# Patient Record
Sex: Male | Born: 1955 | Race: White | Hispanic: No | Smoking: Current every day smoker
Health system: Southern US, Community
[De-identification: ages and names within clinical notes are randomized; demographics above are authoritative.]

## PROBLEM LIST (undated history)

## (undated) DIAGNOSIS — T8859XA Other complications of anesthesia, initial encounter: Secondary | ICD-10-CM

## (undated) DIAGNOSIS — Z8619 Personal history of other infectious and parasitic diseases: Secondary | ICD-10-CM

## (undated) DIAGNOSIS — I10 Essential (primary) hypertension: Secondary | ICD-10-CM

## (undated) DIAGNOSIS — E785 Hyperlipidemia, unspecified: Secondary | ICD-10-CM

## (undated) DIAGNOSIS — G4700 Insomnia, unspecified: Secondary | ICD-10-CM

## (undated) DIAGNOSIS — K279 Peptic ulcer, site unspecified, unspecified as acute or chronic, without hemorrhage or perforation: Secondary | ICD-10-CM

## (undated) DIAGNOSIS — J45909 Unspecified asthma, uncomplicated: Secondary | ICD-10-CM

## (undated) HISTORY — DX: Insomnia, unspecified: G47.00

## (undated) HISTORY — DX: Hyperlipidemia, unspecified: E78.5

## (undated) HISTORY — DX: Essential (primary) hypertension: I10

## (undated) HISTORY — DX: Personal history of other infectious and parasitic diseases: Z86.19

## (undated) HISTORY — PX: BACK SURGERY: SHX140

## (undated) HISTORY — DX: Peptic ulcer, site unspecified, unspecified as acute or chronic, without hemorrhage or perforation: K27.9

## (undated) HISTORY — PX: ESOPHAGOGASTRODUODENOSCOPY: SHX1529

---

## 2014-05-07 ENCOUNTER — Encounter (HOSPITAL_COMMUNITY): Payer: Self-pay | Admitting: Emergency Medicine

## 2014-05-07 DIAGNOSIS — J45909 Unspecified asthma, uncomplicated: Secondary | ICD-10-CM | POA: Insufficient documentation

## 2014-05-07 DIAGNOSIS — A5149 Other secondary syphilitic conditions: Secondary | ICD-10-CM | POA: Insufficient documentation

## 2014-05-07 NOTE — ED Notes (Signed)
Pt. reports intermittent fever for 3 weeks with headache and ankles swelling and rashes at arms and legs onset today .

## 2014-05-08 ENCOUNTER — Emergency Department (HOSPITAL_COMMUNITY)
Admission: EM | Admit: 2014-05-08 | Discharge: 2014-05-08 | Disposition: A | Discharge status: Home / Self Care | Payer: Self-pay | Attending: Emergency Medicine | Admitting: Emergency Medicine

## 2014-05-08 DIAGNOSIS — A5149 Other secondary syphilitic conditions: Secondary | ICD-10-CM

## 2014-05-08 HISTORY — DX: Unspecified asthma, uncomplicated: J45.909

## 2014-05-08 LAB — URINALYSIS, ROUTINE W REFLEX MICROSCOPIC
Bilirubin Urine: NEGATIVE
Glucose, UA: NEGATIVE mg/dL
Hgb urine dipstick: NEGATIVE
Ketones, ur: NEGATIVE mg/dL
Leukocytes, UA: NEGATIVE
Nitrite: NEGATIVE
Protein, ur: NEGATIVE mg/dL
Specific Gravity, Urine: 1.015 (ref 1.005–1.030)
Urobilinogen, UA: 1 mg/dL (ref 0.0–1.0)
pH: 8 (ref 5.0–8.0)

## 2014-05-08 LAB — CBC WITH DIFFERENTIAL/PLATELET
Basophils Absolute: 0.1 10*3/uL (ref 0.0–0.1)
Basophils Relative: 1 % (ref 0–1)
Eosinophils Absolute: 0.2 10*3/uL (ref 0.0–0.7)
Eosinophils Relative: 4 % (ref 0–5)
HCT: 38.6 % — ABNORMAL LOW (ref 39.0–52.0)
Hemoglobin: 12.7 g/dL — ABNORMAL LOW (ref 13.0–17.0)
Lymphocytes Relative: 22 % (ref 12–46)
Lymphs Abs: 1.3 10*3/uL (ref 0.7–4.0)
MCH: 30.8 pg (ref 26.0–34.0)
MCHC: 32.9 g/dL (ref 30.0–36.0)
MCV: 93.5 fL (ref 78.0–100.0)
Monocytes Absolute: 0.8 10*3/uL (ref 0.1–1.0)
Monocytes Relative: 13 % — ABNORMAL HIGH (ref 3–12)
Neutro Abs: 3.4 10*3/uL (ref 1.7–7.7)
Neutrophils Relative %: 60 % (ref 43–77)
Platelets: 251 10*3/uL (ref 150–400)
RBC: 4.13 MIL/uL — ABNORMAL LOW (ref 4.22–5.81)
RDW: 13 % (ref 11.5–15.5)
WBC: 5.7 10*3/uL (ref 4.0–10.5)

## 2014-05-08 LAB — COMPREHENSIVE METABOLIC PANEL
ALT: 40 U/L (ref 0–53)
AST: 30 U/L (ref 0–37)
Albumin: 2.7 g/dL — ABNORMAL LOW (ref 3.5–5.2)
Alkaline Phosphatase: 206 U/L — ABNORMAL HIGH (ref 39–117)
BUN: 10 mg/dL (ref 6–23)
CO2: 26 mEq/L (ref 19–32)
Calcium: 8.3 mg/dL — ABNORMAL LOW (ref 8.4–10.5)
Chloride: 103 mEq/L (ref 96–112)
Creatinine, Ser: 0.74 mg/dL (ref 0.50–1.35)
GFR calc Af Amer: >90 mL/min (ref >90)
GFR calc non Af Amer: >90 mL/min (ref >90)
Glucose, Bld: 113 mg/dL — ABNORMAL HIGH (ref 70–99)
Potassium: 3.8 mEq/L (ref 3.7–5.3)
Sodium: 141 mEq/L (ref 137–147)
Total Bilirubin: 0.3 mg/dL (ref 0.3–1.2)
Total Protein: 7.1 g/dL (ref 6.0–8.3)

## 2014-05-08 MED ORDER — PENICILLIN G BENZATHINE 1200000 UNIT/2ML IM SUSP
2.4000 10*6.[IU] | Freq: Once | INTRAMUSCULAR | Status: AC
Start: 2014-05-08 — End: 2014-05-08
  Administered 2014-05-08: 2.4 10*6.[IU] via INTRAMUSCULAR
  Filled 2014-05-08: qty 4

## 2014-05-08 MED ORDER — ACETAMINOPHEN 325 MG PO TABS
650.0000 mg | ORAL_TABLET | Freq: Four times a day (QID) | ORAL | Status: DC | PRN
  Administered 2014-05-08: 650 mg via ORAL

## 2014-05-08 NOTE — ED Provider Notes (Addendum)
CSN: [id number]     Arrival date & time 05/07/14  2305 History   First MD Initiated Contact with Patient 05/08/14 0308     Chief Complaint  Patient presents with  . Syphilis     (Consider location/radiation/quality/duration/timing/severity/associated sxs/prior Treatment) HPI This is a 58 year old homosexual male with a three-week history of fever, weakness, malaise, headache and aching from the shoulders up. He has been seeing his primary care physician in [place], and was worked up for Lyme disease and tuberculosis. These were negative. About a week and a half ago he developed a generalized maculopapular rash that includes the palms. Several days ago he was further tested for HIV and syphilis. 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 He was sent here for definitive treatment for his syphilis. He has had about a 10 pound weight loss during the past 3 weeks. Because his liver enzymes were elevated he was taken off of his Lipitor. He also stopped taking his hydrochlorothiazide and as a result has gained back about 10 pounds which she attributes to edema in his ankles. Early in the course he had stiffness of the neck but this is resolved. He denies being off balance. He denies visual changes. He has no recollection of a chancre.  Past Medical History  Diagnosis Date  . Asthma    Past Surgical History  Procedure Laterality Date  . Back surgery     No family history on file. History  Substance Use Topics  . Smoking status: Never Smoker   . Smokeless tobacco: Not on file  . Alcohol Use: Yes    Review of Systems  All other systems reviewed and are negative.   Allergies  Codeine and Tetracyclines & related  Home Medications   Prior to Admission medications   Medication Sig Start Date End Date Taking? Authorizing Provider  acetaminophen (TYLENOL) 500 MG tablet Take 1,000 mg by mouth every 6 (six) hours as needed for mild pain or headache.    Yes Historical Provider, MD  Aspirin-Salicylamide-Caffeine (BC HEADACHE POWDER PO) Take 1 Package by mouth every 6 (six) hours as needed (pain).   Yes Historical Provider, MD  phenylephrine (NEO-SYNEPHRINE) 1 % nasal spray Place 1 drop into both nostrils 4 (four) times daily as needed for congestion.   Yes Historical Provider, MD  Sodium Chloride-Sodium Bicarb (SINUS WASH SQUEEZE BOTTLE NA) Place 2 sprays into both nostrils 2 (two) times daily as needed (congestion).   Yes Historical Provider, MD   BP 136/78  Pulse 99  Temp(Src) 100.3 F (37.9 C) (Oral)  Resp 16  SpO2 99%  Physical Exam General: Well-developed, well-nourished male in no acute distress; appearance consistent with age of record HENT: normocephalic; atraumatic Eyes: pupils equal, round and reactive to light; extraocular muscles intact; no Argyll Robertson pupil Neck: supple Heart: regular rate and rhythm Lungs: clear to auscultation bilaterally Abdomen: soft; nondistended; nontender; no masses or hepatosplenomegaly; bowel sounds present Extremities: No deformity; full range of motion; pulses normal; 1+ edema of the ankles and feet Neurologic: Awake, alert and oriented; motor function intact in all extremities and symmetric; no facial droop; normal coordination and speech; negative Romberg; normal finger to nose Skin: Warm and dry; generalized maculopapular rash including the soles Psychiatric: Normal mood and affect    ED Course  Procedures (including critical care time)  MDM   Nursing notes and vitals signs, including pulse oximetry, reviewed.  Summary of this visit's results, reviewed by myself:  Labs:  Results for orders placed during the hospital encounter of 05/08/14 (from the past 24 hour(s))  URINALYSIS, ROUTINE W REFLEX MICROSCOPIC     Status: Abnormal   Collection Time    05/07/14 11:51 PM      Result Value Ref Range   Color, Urine YELLOW  YELLOW   APPearance CLOUDY (*) CLEAR   Specific Gravity,  Urine 1.015  1.005 - 1.030   pH 8.0  5.0 - 8.0   Glucose, UA NEGATIVE  NEGATIVE mg/dL   Hgb urine dipstick NEGATIVE  NEGATIVE   Bilirubin Urine NEGATIVE  NEGATIVE   Ketones, ur NEGATIVE  NEGATIVE mg/dL   Protein, ur NEGATIVE  NEGATIVE mg/dL   Urobilinogen, UA 1.0  0.0 - 1.0 mg/dL   Nitrite NEGATIVE  NEGATIVE   Leukocytes, UA NEGATIVE  NEGATIVE  CBC WITH DIFFERENTIAL     Status: Abnormal   Collection Time    05/07/14 11:52 PM      Result Value Ref Range   WBC 5.7  4.0 - 10.5 K/uL   RBC 4.13 (*) 4.22 - 5.81 MIL/uL   Hemoglobin 12.7 (*) 13.0 - 17.0 g/dL   HCT 62.6 (*) 94.8 - 54.6 %   MCV 93.5  78.0 - 100.0 fL   MCH 30.8  26.0 - 34.0 pg   MCHC 32.9  30.0 - 36.0 g/dL   RDW 27.0  35.0 - 09.3 %   Platelets 251  150 - 400 K/uL   Neutrophils Relative % 60  43 - 77 %   Neutro Abs 3.4  1.7 - 7.7 K/uL   Lymphocytes Relative 22  12 - 46 %   Lymphs Abs 1.3  0.7 - 4.0 K/uL   Monocytes Relative 13 (*) 3 - 12 %   Monocytes Absolute 0.8  0.1 - 1.0 K/uL   Eosinophils Relative 4  0 - 5 %   Eosinophils Absolute 0.2  0.0 - 0.7 K/uL   Basophils Relative 1  0 - 1 %   Basophils Absolute 0.1  0.0 - 0.1 K/uL  COMPREHENSIVE METABOLIC PANEL     Status: Abnormal   Collection Time    05/07/14 11:52 PM      Result Value Ref Range   Sodium 141  137 - 147 mEq/L   Potassium 3.8  3.7 - 5.3 mEq/L   Chloride 103  96 - 112 mEq/L   CO2 26  19 - 32 mEq/L   Glucose, Bld 113 (*) 70 - 99 mg/dL   BUN 10  6 - 23 mg/dL   Creatinine, Ser 8.18  0.50 - 1.35 mg/dL   Calcium 8.3 (*) 8.4 - 10.5 mg/dL   Total Protein 7.1  6.0 - 8.3 g/dL   Albumin 2.7 (*) 3.5 - 5.2 g/dL   AST 30  0 - 37 U/L   ALT 40  0 - 53 U/L   Alkaline Phosphatase 206 (*) 39 - 117 U/L   Total Bilirubin 0.3  0.3 - 1.2 mg/dL   GFR calc non Af Amer >90  >90 mL/min   GFR calc Af Amer >90  >90 mL/min   5:34 AM Patient given 2.4 million units of Bicillin LA IM. He tolerated this well. He is showing no signs of tabes dorsalis. He was advised of the  possibility of a rare Jarisch-Herxheimer reaction and advised to return if these symptoms occur and are unmanageable. We will refer him to the [facility] for followup.  7:29 AM Discussed with Dr. Ilsa Iha  of infectious disease. She will see that he is appropriately followed up.    Hanley SeamenJohn L Ryoma Nofziger, MD 05/08/14 0535  Hanley SeamenJohn L Damondre Pfeifle, MD 05/08/14 [id number]  Carlisle BeersJohn L Bart Ashford, MD 05/08/14 0730

## 2014-05-08 NOTE — ED Notes (Signed)
IGNORE FIRST TRIAGE NOTE, INCORRECT PT. UNABLE TO DELETE.    Pt presents to the department from his primary care provider, pt informed on 5/20 that he tested positive for RPR, pt informed he needs to get immediate tx or he will die. Pt presents with a rash on his lower extremities, bilateral arms, and palms of the pt's hands. Pt states he has significant neck and head pain. Pt has swelling in his lower extremities as well, pt states he has not taken his BP medication or his fluid retention medications since last Wednesday. Pt is A&O X4. Pt reports he is in a lot of pain.

## 2014-05-08 NOTE — ED Notes (Signed)
MD at bedside. 

## 2014-05-28 ENCOUNTER — Telehealth (HOSPITAL_BASED_OUTPATIENT_CLINIC_OR_DEPARTMENT_OTHER): Payer: Self-pay

## 2014-05-28 NOTE — Telephone Encounter (Signed)
[facility] calling to find out if pts blood was redrawn when he came in for tx for (+) RPR Informed blood not redrawn

## 2020-05-14 DIAGNOSIS — R42 Dizziness and giddiness: Secondary | ICD-10-CM | POA: Diagnosis not present

## 2020-05-14 DIAGNOSIS — I10 Essential (primary) hypertension: Secondary | ICD-10-CM | POA: Diagnosis not present

## 2020-05-14 DIAGNOSIS — E785 Hyperlipidemia, unspecified: Secondary | ICD-10-CM | POA: Diagnosis not present

## 2020-05-15 DIAGNOSIS — F17200 Nicotine dependence, unspecified, uncomplicated: Secondary | ICD-10-CM | POA: Diagnosis not present

## 2020-05-15 DIAGNOSIS — R55 Syncope and collapse: Secondary | ICD-10-CM | POA: Diagnosis not present

## 2020-05-15 DIAGNOSIS — R42 Dizziness and giddiness: Secondary | ICD-10-CM | POA: Diagnosis not present

## 2020-07-21 DIAGNOSIS — Z1329 Encounter for screening for other suspected endocrine disorder: Secondary | ICD-10-CM | POA: Diagnosis not present

## 2020-07-21 DIAGNOSIS — Z0000 Encounter for general adult medical examination without abnormal findings: Secondary | ICD-10-CM | POA: Diagnosis not present

## 2020-07-21 DIAGNOSIS — Z13820 Encounter for screening for osteoporosis: Secondary | ICD-10-CM | POA: Diagnosis not present

## 2020-07-21 DIAGNOSIS — Z125 Encounter for screening for malignant neoplasm of prostate: Secondary | ICD-10-CM | POA: Diagnosis not present

## 2020-07-21 DIAGNOSIS — Z136 Encounter for screening for cardiovascular disorders: Secondary | ICD-10-CM | POA: Diagnosis not present

## 2020-07-21 DIAGNOSIS — I10 Essential (primary) hypertension: Secondary | ICD-10-CM | POA: Diagnosis not present

## 2020-07-21 DIAGNOSIS — E785 Hyperlipidemia, unspecified: Secondary | ICD-10-CM | POA: Diagnosis not present

## 2020-07-21 DIAGNOSIS — Z79899 Other long term (current) drug therapy: Secondary | ICD-10-CM | POA: Diagnosis not present

## 2020-07-21 DIAGNOSIS — E6609 Other obesity due to excess calories: Secondary | ICD-10-CM | POA: Diagnosis not present

## 2020-08-14 DIAGNOSIS — Z125 Encounter for screening for malignant neoplasm of prostate: Secondary | ICD-10-CM | POA: Diagnosis not present

## 2020-08-14 DIAGNOSIS — E6609 Other obesity due to excess calories: Secondary | ICD-10-CM | POA: Diagnosis not present

## 2020-08-14 DIAGNOSIS — I10 Essential (primary) hypertension: Secondary | ICD-10-CM | POA: Diagnosis not present

## 2020-08-14 DIAGNOSIS — E785 Hyperlipidemia, unspecified: Secondary | ICD-10-CM | POA: Diagnosis not present

## 2020-08-14 DIAGNOSIS — Z0000 Encounter for general adult medical examination without abnormal findings: Secondary | ICD-10-CM | POA: Diagnosis not present

## 2020-08-14 DIAGNOSIS — Z1329 Encounter for screening for other suspected endocrine disorder: Secondary | ICD-10-CM | POA: Diagnosis not present

## 2020-08-14 DIAGNOSIS — Z79899 Other long term (current) drug therapy: Secondary | ICD-10-CM | POA: Diagnosis not present

## 2020-08-20 DIAGNOSIS — E559 Vitamin D deficiency, unspecified: Secondary | ICD-10-CM | POA: Diagnosis not present

## 2020-08-20 DIAGNOSIS — Z79899 Other long term (current) drug therapy: Secondary | ICD-10-CM | POA: Diagnosis not present

## 2020-08-20 DIAGNOSIS — I10 Essential (primary) hypertension: Secondary | ICD-10-CM | POA: Diagnosis not present

## 2020-08-20 DIAGNOSIS — E785 Hyperlipidemia, unspecified: Secondary | ICD-10-CM | POA: Diagnosis not present

## 2020-10-26 ENCOUNTER — Encounter: Payer: Self-pay | Admitting: *Deleted

## 2020-11-17 ENCOUNTER — Ambulatory Visit: Payer: Self-pay

## 2020-12-15 ENCOUNTER — Other Ambulatory Visit: Payer: Self-pay

## 2020-12-15 ENCOUNTER — Ambulatory Visit (INDEPENDENT_AMBULATORY_CARE_PROVIDER_SITE_OTHER): Payer: Self-pay | Admitting: *Deleted

## 2020-12-15 DIAGNOSIS — Z1211 Encounter for screening for malignant neoplasm of colon: Secondary | ICD-10-CM

## 2020-12-15 NOTE — Progress Notes (Signed)
Pt came in for triage and informed me that he could not have procedure done due to lack of transportation.  He said that he didn't have anyone to take him.  Informed PCP.  Routing to Wynne Dust, NP as Lorain Childes.

## 2020-12-20 NOTE — Progress Notes (Signed)
Noted. If he's unable to complete TCS, perhaps PCP can arrange for Cologuard as a last option.

## 2020-12-21 NOTE — Progress Notes (Signed)
Called PCP and spoke to [place].  Informed her to see if PCP could arrange for Cologuard.  She is routing a note to the provider.  Called pt and informed him of the plan.  Pt voiced understanding.

## 2020-12-31 DIAGNOSIS — Z1212 Encounter for screening for malignant neoplasm of rectum: Secondary | ICD-10-CM | POA: Diagnosis not present

## 2020-12-31 DIAGNOSIS — Z1211 Encounter for screening for malignant neoplasm of colon: Secondary | ICD-10-CM | POA: Diagnosis not present

## 2021-01-19 DIAGNOSIS — K921 Melena: Secondary | ICD-10-CM | POA: Diagnosis not present

## 2021-01-19 DIAGNOSIS — G4700 Insomnia, unspecified: Secondary | ICD-10-CM | POA: Diagnosis not present

## 2021-02-15 DIAGNOSIS — I10 Essential (primary) hypertension: Secondary | ICD-10-CM | POA: Diagnosis not present

## 2021-02-15 DIAGNOSIS — E785 Hyperlipidemia, unspecified: Secondary | ICD-10-CM | POA: Diagnosis not present

## 2021-02-18 DIAGNOSIS — I10 Essential (primary) hypertension: Secondary | ICD-10-CM | POA: Diagnosis not present

## 2021-02-18 DIAGNOSIS — E785 Hyperlipidemia, unspecified: Secondary | ICD-10-CM | POA: Diagnosis not present

## 2021-03-03 ENCOUNTER — Telehealth: Payer: Self-pay | Admitting: Internal Medicine

## 2021-03-03 NOTE — Telephone Encounter (Signed)
Pt called to schedule his colonoscopy. He was triaged on 12/15/2020. [phone]

## 2021-03-03 NOTE — Telephone Encounter (Signed)
Spoke to pt.  He said that he did a cologuard test and would now like to have a colonoscopy.  Informed him that we would need new referral since we did not do a triage in Jan since he declined.  Pt said that PCP told him to have [place] to call them if referral needed.  Called PCP and requested new referral to be sent to [place].  Rolly Salter at [facility] is having the nurse to send [place] one.

## 2021-03-09 ENCOUNTER — Encounter: Payer: Self-pay | Admitting: Gastroenterology

## 2021-04-15 NOTE — Progress Notes (Signed)
Referring Provider: Marylynn Pearson, FNP Primary Care Physician:  Marylynn Pearson, FNP Primary Gastroenterologist:  Dr. Marletta Lor  Chief Complaint  Patient presents with  . positive cologuard    No TCS done prior. No dark stools, no blood in stools. No FH colon cancer/polyps. Had nurse visit 12/15/20    HPI:   David Parks is a 65 y.o. male presenting today at the request of Marylynn Pearson, FNP for consult colonoscopy due to positive Cologuard which was completed 12/31/2020.  Today:  Today states he is doing well.  Denies abdominal pain, BRBPR, melena. Occasional constipation maybe once a month. No diarrhea. No family history of colon cancer.  No prior colonoscopy.   No significant upper GI symptoms.  Reports reflux about once a week or less.  Denies nausea, vomiting, or dysphagia. EGD in the past at [facility] for GERD in the 90s.  Reports he had an ulcer and took Nexium at that time.  He has not required a PPI many years. Takes BC powders once every 2-3 months.  No other NSAIDs.  Reports he has a hernia on the left side that will protrude intermittently.  This is in the left inguinal area.  Also has intermittent protruding hernia in the left lower and left upper abdomen?  States hernias will reduce when lying down or if he reduces them back in himself.  No pain.  Past Medical History:  Diagnosis Date  . Asthma    as a child  . HTN (hypertension)   . Hyperlipidemia   . Insomnia   . PUD (peptic ulcer disease)    Per patient, EGD in the 90s at [facility] with ulcer    Past Surgical History:  Procedure Laterality Date  . BACK SURGERY     x2  . ESOPHAGOGASTRODUODENOSCOPY     Per patient, EGD in the 90s at [facility] with ulcer.    Current Outpatient Medications  Medication Sig Dispense Refill  . aspirin EC 81 MG tablet Take 81 mg by mouth daily. Swallow whole.    . Aspirin-Salicylamide-Caffeine (BC HEADACHE POWDER PO) Take 1 Package by mouth every 6 (six) hours as needed (pain).    Marland Kitchen  diphenhydrAMINE (BENADRYL) 25 MG tablet Take 25 mg by mouth every 6 (six) hours as needed.    . hydrochlorothiazide (HYDRODIURIL) 50 MG tablet Take 50 mg by mouth daily.    Marland Kitchen lovastatin (MEVACOR) 40 MG tablet Take 40 mg by mouth at bedtime.    . Melatonin 10 MG TABS Take by mouth as needed.    . Omega-3 Fatty Acids (FISH OIL PO) Take by mouth daily.    . Probiotic Product (PROBIOTIC PO) Take by mouth daily.    . traZODone (DESYREL) 100 MG tablet Take 100 mg by mouth at bedtime as needed for sleep.    . polyethylene glycol-electrolytes (NULYTELY) 420 g solution As directed 4000 mL 0   No current facility-administered medications for this visit.    Allergies as of 04/16/2021 - Review Complete 04/16/2021  Allergen Reaction Noted  . Codeine Other (See Comments) 05/08/2014  . Tetracyclines & related Other (See Comments) 05/08/2014    Family History  Problem Relation Age of Onset  . Colon cancer Neg Hx     Social History   Socioeconomic History  . Marital status: Single    Spouse name: Not on file  . Number of children: Not on file  . Years of education: Not on file  . Highest education level: Not on file  Occupational History  . Not on file  Tobacco Use  . Smoking status: Current Every Day Smoker    Types: Cigarettes  . Smokeless tobacco: Never Used  Substance and Sexual Activity  . Alcohol use: Yes    Comment: rare  . Drug use: Never  . Sexual activity: Not on file  Other Topics Concern  . Not on file  Social History Narrative  . Not on file   Social Determinants of Health   Financial Resource Strain: Not on file  Food Insecurity: Not on file  Transportation Needs: Not on file  Physical Activity: Not on file  Stress: Not on file  Social Connections: Not on file  Intimate Partner Violence: Not on file    Review of Systems: Gen: Denies any fever, chills, cold or flulike symptoms, lightheadedness, dizziness, presyncope, syncope. CV: Denies chest pain or  palpitations. Resp: Denies shortness of breath.  Intermittent cough related to allergies. GI: See HPI GU : Denies urinary burning, urinary frequency, urinary hesitancy MS: Denies joint pain Derm: Denies rash Psych: Admits to depression.  No SI. Heme: See HPI  Physical Exam: BP 123/78   Pulse 78   Temp (!) 97.5 F (36.4 C)   Ht 6\' 2"  (1.88 m)   Wt 247 lb 3.2 oz (112.1 kg)   BMI 31.74 kg/m  General:   Alert and oriented. Pleasant and cooperative. Well-nourished and well-developed.  Head:  Normocephalic and atraumatic. Eyes:  Without icterus, sclera clear and conjunctiva pink.  Ears:  Normal auditory acuity. Lungs:  Clear to auscultation bilaterally. No wheezes, rales, or rhonchi. No distress.  Heart:  S1, S2 present without murmurs appreciated.  Abdomen:  +BS, soft, non-tender and non-distended. No HSM noted. No guarding or rebound. No masses appreciated.  Rectal:  Deferred  Msk:  Symmetrical without gross deformities. Normal posture. Extremities:  Without edema. Neurologic:  Alert and  oriented x4;  grossly normal neurologically. Skin:  Intact without significant lesions or rashes. Psych:  Normal mood and affect.   Assessment: 65 year old male with history of HTN, HLD, insomnia presenting today to schedule first ever colonoscopy.  Recently had Cologuard which was positive in January 2022.  Denies any significant upper or lower GI symptoms.  No alarm symptoms.  He does tell me that he has a hernia that protrudes intermittently in the left inguinal area as well as the left lower and left upper abdomen.  On exam, I am not able to appreciate any abdominal wall hernias.  I did not examine his left inguinal area.  I did offer him a referral to general surgery, but he requested to hold off on this for now.   Plan: 1.  Proceed with colonoscopy with propofol with Dr. February 2022 in the near future. The risks, benefits, and alternatives have been discussed with the patient in detail. The patient  states understanding and desires to proceed.  ASA II  2.  Advised patient to let me know if he would like referral to general surgery for further evaluation of hernia.  3.  Advised if his hernia were to protrude and he was unable to reduce it or if he had any significant pain related to his hernia, he should proceed to the emergency room.  4.  Follow-up as needed.  Advised to call with any questions or concerns.    Marletta Lor, PA-C [facility] 04/16/2021

## 2021-04-16 ENCOUNTER — Encounter: Payer: Self-pay | Admitting: *Deleted

## 2021-04-16 ENCOUNTER — Encounter: Payer: Self-pay | Admitting: Gastroenterology

## 2021-04-16 ENCOUNTER — Other Ambulatory Visit: Payer: Self-pay

## 2021-04-16 ENCOUNTER — Ambulatory Visit (INDEPENDENT_AMBULATORY_CARE_PROVIDER_SITE_OTHER): Payer: Medicare HMO | Admitting: Gastroenterology

## 2021-04-16 VITALS — BP 123/78 | HR 78 | Temp 97.5°F | Ht 74.0 in | Wt 247.2 lb

## 2021-04-16 DIAGNOSIS — K4090 Unilateral inguinal hernia, without obstruction or gangrene, not specified as recurrent: Secondary | ICD-10-CM

## 2021-04-16 DIAGNOSIS — R195 Other fecal abnormalities: Secondary | ICD-10-CM | POA: Diagnosis not present

## 2021-04-16 MED ORDER — PEG 3350-KCL-NA BICARB-NACL 420 G PO SOLR: ORAL | 0 refills | Status: DC

## 2021-04-16 NOTE — Patient Instructions (Signed)
We will arrange for you to have a colonoscopy in the near future with Dr. Marletta Lor.  As we discussed, we can refer you to general surgery for evaluation of your hernia. Let me know if you would like a referral.   If your hernia protrudes and will not reduce or causes you any significant pain, you should proceed to the emergency room.  We will follow-up with you as Dr. Marletta Lor recommends. Do not hesitate to call with any new GI concerns.   Ermalinda Memos, PA-C [facility]

## 2021-04-19 ENCOUNTER — Telehealth: Payer: Self-pay | Admitting: *Deleted

## 2021-04-19 NOTE — Telephone Encounter (Signed)
PA approved via [employer]. Auth# [id number] DOS 05/17/2021-06/16/2021

## 2021-04-21 NOTE — Progress Notes (Signed)
Cc'ed to pcp °

## 2021-05-12 ENCOUNTER — Telehealth: Payer: Self-pay | Admitting: *Deleted

## 2021-05-12 NOTE — Telephone Encounter (Signed)
Received call from endo unable to reach patient. covid test cancelled but still needs lab work done on Friday  Called pt, no answer and not able to leave VM

## 2021-05-13 DIAGNOSIS — F17210 Nicotine dependence, cigarettes, uncomplicated: Secondary | ICD-10-CM | POA: Diagnosis not present

## 2021-05-13 DIAGNOSIS — R195 Other fecal abnormalities: Secondary | ICD-10-CM | POA: Diagnosis present

## 2021-05-13 DIAGNOSIS — Z881 Allergy status to other antibiotic agents status: Secondary | ICD-10-CM | POA: Diagnosis not present

## 2021-05-13 DIAGNOSIS — Z8711 Personal history of peptic ulcer disease: Secondary | ICD-10-CM | POA: Diagnosis not present

## 2021-05-13 DIAGNOSIS — E785 Hyperlipidemia, unspecified: Secondary | ICD-10-CM | POA: Diagnosis not present

## 2021-05-13 DIAGNOSIS — Z79899 Other long term (current) drug therapy: Secondary | ICD-10-CM | POA: Diagnosis not present

## 2021-05-13 DIAGNOSIS — K56609 Unspecified intestinal obstruction, unspecified as to partial versus complete obstruction: Secondary | ICD-10-CM | POA: Diagnosis not present

## 2021-05-13 DIAGNOSIS — K5730 Diverticulosis of large intestine without perforation or abscess without bleeding: Secondary | ICD-10-CM | POA: Diagnosis not present

## 2021-05-13 DIAGNOSIS — Z5309 Procedure and treatment not carried out because of other contraindication: Secondary | ICD-10-CM | POA: Diagnosis not present

## 2021-05-13 DIAGNOSIS — Z885 Allergy status to narcotic agent status: Secondary | ICD-10-CM | POA: Diagnosis not present

## 2021-05-13 DIAGNOSIS — K648 Other hemorrhoids: Secondary | ICD-10-CM | POA: Diagnosis not present

## 2021-05-13 LAB — BASIC METABOLIC PANEL
Anion gap: 7 (ref 5–15)
BUN: 14 mg/dL (ref 8–23)
CO2: 27 mmol/L (ref 22–32)
Calcium: 9.3 mg/dL (ref 8.9–10.3)
Chloride: 105 mmol/L (ref 98–111)
Creatinine, Ser: 1.04 mg/dL (ref 0.61–1.24)
GFR, Estimated: >60 mL/min (ref >60)
Glucose, Bld: 88 mg/dL (ref 70–99)
Potassium: 3.8 mmol/L (ref 3.5–5.1)
Sodium: 139 mmol/L (ref 135–145)

## 2021-05-13 NOTE — Telephone Encounter (Signed)
Called pt, no answer and not able to leave VM 

## 2021-05-14 ENCOUNTER — Other Ambulatory Visit (HOSPITAL_COMMUNITY): Admission: RE | Admit: 2021-05-14 | Payer: Medicare HMO | Source: Ambulatory Visit

## 2021-05-17 ENCOUNTER — Ambulatory Visit (HOSPITAL_COMMUNITY)
Admission: RE | Admit: 2021-05-17 | Discharge: 2021-05-17 | Disposition: A | Discharge status: Home / Self Care | Payer: Medicare HMO | Attending: Internal Medicine | Admitting: Internal Medicine

## 2021-05-17 ENCOUNTER — Ambulatory Visit (HOSPITAL_COMMUNITY): Payer: Medicare HMO | Admitting: Anesthesiology

## 2021-05-17 ENCOUNTER — Encounter (HOSPITAL_COMMUNITY)
Admission: RE | Disposition: A | Discharge status: Home / Self Care | Payer: Self-pay | Source: Home / Self Care | Attending: Internal Medicine

## 2021-05-17 ENCOUNTER — Encounter (HOSPITAL_COMMUNITY): Payer: Self-pay

## 2021-05-17 ENCOUNTER — Other Ambulatory Visit: Payer: Self-pay

## 2021-05-17 ENCOUNTER — Telehealth: Payer: Self-pay

## 2021-05-17 DIAGNOSIS — K648 Other hemorrhoids: Secondary | ICD-10-CM | POA: Insufficient documentation

## 2021-05-17 DIAGNOSIS — Z5309 Procedure and treatment not carried out because of other contraindication: Secondary | ICD-10-CM | POA: Insufficient documentation

## 2021-05-17 DIAGNOSIS — Z885 Allergy status to narcotic agent status: Secondary | ICD-10-CM | POA: Insufficient documentation

## 2021-05-17 DIAGNOSIS — K56609 Unspecified intestinal obstruction, unspecified as to partial versus complete obstruction: Secondary | ICD-10-CM | POA: Insufficient documentation

## 2021-05-17 DIAGNOSIS — K56699 Other intestinal obstruction unspecified as to partial versus complete obstruction: Secondary | ICD-10-CM | POA: Diagnosis not present

## 2021-05-17 DIAGNOSIS — Z79899 Other long term (current) drug therapy: Secondary | ICD-10-CM | POA: Insufficient documentation

## 2021-05-17 DIAGNOSIS — Z8711 Personal history of peptic ulcer disease: Secondary | ICD-10-CM | POA: Insufficient documentation

## 2021-05-17 DIAGNOSIS — R195 Other fecal abnormalities: Secondary | ICD-10-CM | POA: Insufficient documentation

## 2021-05-17 DIAGNOSIS — Z881 Allergy status to other antibiotic agents status: Secondary | ICD-10-CM | POA: Insufficient documentation

## 2021-05-17 DIAGNOSIS — K5730 Diverticulosis of large intestine without perforation or abscess without bleeding: Secondary | ICD-10-CM | POA: Diagnosis not present

## 2021-05-17 DIAGNOSIS — F17210 Nicotine dependence, cigarettes, uncomplicated: Secondary | ICD-10-CM | POA: Insufficient documentation

## 2021-05-17 DIAGNOSIS — E785 Hyperlipidemia, unspecified: Secondary | ICD-10-CM | POA: Insufficient documentation

## 2021-05-17 HISTORY — PX: FLEXIBLE SIGMOIDOSCOPY: SHX5431

## 2021-05-17 SURGERY — SIGMOIDOSCOPY, FLEXIBLE
Anesthesia: General

## 2021-05-17 MED ORDER — STERILE WATER FOR IRRIGATION IR SOLN
Status: DC | PRN
  Administered 2021-05-17: 1.5 mL

## 2021-05-17 MED ORDER — LIDOCAINE HCL (PF) 2 % IJ SOLN
INTRAMUSCULAR | Status: AC
  Filled 2021-05-17: qty 5

## 2021-05-17 MED ORDER — LACTATED RINGERS IV SOLN: INTRAVENOUS | Status: DC

## 2021-05-17 MED ORDER — LIDOCAINE HCL (CARDIAC) PF 100 MG/5ML IV SOSY
PREFILLED_SYRINGE | INTRAVENOUS | Status: DC | PRN
  Administered 2021-05-17: 100 mg via INTRAVENOUS

## 2021-05-17 MED ORDER — PROPOFOL 10 MG/ML IV BOLUS
INTRAVENOUS | Status: DC | PRN
  Administered 2021-05-17: 125 ug/kg/min via INTRAVENOUS
  Administered 2021-05-17: 100 mg via INTRAVENOUS

## 2021-05-17 NOTE — H&P (Signed)
Primary Care Physician:  Marylynn Pearson, FNP Primary Gastroenterologist:  Dr. Marletta Lor  Pre-Procedure History & Physical: HPI:  David Parks is a 65 y.o. male is here for colonoscopy due to positive Cologuard which was completed 12/31/2020.  Patient denies any family history of colorectal cancer.  No melena or hematochezia.  No unintentional weight loss.  No change in bowel habits.   Past Medical History:  Diagnosis Date  . Asthma    as a child  . HTN (hypertension)   . Hyperlipidemia   . Insomnia   . PUD (peptic ulcer disease)    Per patient, EGD in the 90s at [facility] with ulcer    Past Surgical History:  Procedure Laterality Date  . BACK SURGERY     x2  . ESOPHAGOGASTRODUODENOSCOPY     Per patient, EGD in the 90s at [facility] with ulcer.    Prior to Admission medications   Medication Sig Start Date End Date Taking? Authorizing Provider  aspirin EC 81 MG tablet Take 81 mg by mouth in the morning. Swallow whole.   Yes [provider]  Cholecalciferol (VITAMIN D3) 50 MCG (2000 UT) TABS Take by mouth in the morning.   Yes [provider]  dimenhyDRINATE (DRAMAMINE) 50 MG tablet Take 100 mg by mouth at bedtime as needed (sleep).   Yes [provider]  GAVILYTE-G 236 g solution Take 4,000 mLs by mouth as directed. 04/16/21  Yes [provider]  hydrochlorothiazide (HYDRODIURIL) 25 MG tablet Take 25 mg by mouth in the morning. 02/15/21  Yes [provider]  Lactobacillus (ACIDOPHILUS) CAPS capsule Take 1 capsule by mouth daily with supper.   Yes [provider]  lovastatin (MEVACOR) 40 MG tablet Take 40 mg by mouth every evening.   Yes [provider]  meclizine (ANTIVERT) 25 MG tablet Take 25 mg by mouth at bedtime as needed (sleep).   Yes [provider]  melatonin 5 MG TABS Take 10 mg by mouth at bedtime.   Yes [provider]  Omega-3 Fatty Acids (FISH OIL) 1000 MG CAPS Take 1,000 mg by mouth daily with  supper.   Yes [provider]  traZODone (DESYREL) 100 MG tablet Take 100 mg by mouth at bedtime as needed for sleep.   Yes [provider]  Aspirin-Salicylamide-Caffeine (BC HEADACHE POWDER PO) Take 1 Package by mouth daily as needed (pain).    [provider]  polyethylene glycol-electrolytes (NULYTELY) 420 g solution As directed Patient not taking: Reported on 05/06/2021 04/16/21   Lanelle Bal, DO    Allergies as of 04/16/2021 - Review Complete 04/16/2021  Allergen Reaction Noted  . Codeine Other (See Comments) 05/08/2014  . Tetracyclines & related Other (See Comments) 05/08/2014    Family History  Problem Relation Age of Onset  . Colon cancer Neg Hx     Social History   Socioeconomic History  . Marital status: Single    Spouse name: Not on file  . Number of children: Not on file  . Years of education: Not on file  . Highest education level: Not on file  Occupational History  . Not on file  Tobacco Use  . Smoking status: Current Every Day Smoker    Types: Cigarettes  . Smokeless tobacco: Never Used  Substance and Sexual Activity  . Alcohol use: Yes    Comment: rare  . Drug use: Never  . Sexual activity: Not on file  Other Topics Concern  . Not on file  Social History Narrative  . Not on file   Social Determinants of Health   Financial Resource Strain: Not on file  Food Insecurity: Not on file  Transportation Needs: Not on file  Physical Activity: Not on file  Stress: Not on file  Social Connections: Not on file  Intimate Partner Violence: Not on file    Review of Systems: See HPI, otherwise negative ROS  Physical Exam: Vital signs in last 24 hours: Temp:  [98.8 F (37.1 C)] 98.8 F (37.1 C) (06/06 1148) Pulse Rate:  [102] 102 (06/06 1148) Resp:  [13] 13 (06/06 1148) BP: (146)/(82) 146/82 (06/06 1148) SpO2:  [100 %] 100 % (06/06 1148) Weight:  [108.9 kg] 108.9 kg (06/06 1148)   General:   Alert,  Well-developed,  well-nourished, pleasant and cooperative in NAD Head:  Normocephalic and atraumatic. Eyes:  Sclera clear, no icterus.   Conjunctiva pink. Ears:  Normal auditory acuity. Nose:  No deformity, discharge,  or lesions. Mouth:  No deformity or lesions, dentition normal. Neck:  Supple; no masses or thyromegaly. Lungs:  Clear throughout to auscultation.   No wheezes, crackles, or rhonchi. No acute distress. Heart:  Regular rate and rhythm; no murmurs, clicks, rubs,  or gallops. Abdomen:  Soft, nontender and nondistended. No masses, hepatosplenomegaly or hernias noted. Normal bowel sounds, without guarding, and without rebound.   Msk:  Symmetrical without gross deformities. Normal posture. Extremities:  Without clubbing or edema. Neurologic:  Alert and  oriented x4;  grossly normal neurologically. Skin:  Intact without significant lesions or rashes. Cervical Nodes:  No significant cervical adenopathy. Psych:  Alert and cooperative. Normal mood and affect.  Impression/Plan: David Parks is here for colonoscopy due to positive Cologuard which was completed 12/31/2020.  The risks of the procedure including infection, bleed, or perforation as well as benefits, limitations, alternatives and imponderables have been reviewed with the patient. Questions have been answered. All parties agreeable.

## 2021-05-17 NOTE — Discharge Instructions (Addendum)
  Colonoscopy Discharge Instructions  Read the instructions outlined below and refer to this sheet in the next few weeks. These discharge instructions provide you with general information on caring for yourself after you leave the hospital. Your doctor may also give you specific instructions. While your treatment has been planned according to the most current medical practices available, unavoidable complications occasionally occur.   ACTIVITY  You may resume your regular activity, but move at a slower pace for the next 24 hours.   Take frequent rest periods for the next 24 hours.   Walking will help get rid of the air and reduce the bloated feeling in your belly (abdomen).   No driving for 24 hours (because of the medicine (anesthesia) used during the test).    Do not sign any important legal documents or operate any machinery for 24 hours (because of the anesthesia used during the test).  NUTRITION  Drink plenty of fluids.   You may resume your normal diet as instructed by your doctor.   Begin with a light meal and progress to your normal diet. Heavy or fried foods are harder to digest and may make you feel sick to your stomach (nauseated).   Avoid alcoholic beverages for 24 hours or as instructed.  MEDICATIONS  You may resume your normal medications unless your doctor tells you otherwise.  WHAT YOU CAN EXPECT TODAY  Some feelings of bloating in the abdomen.   Passage of more gas than usual.   Spotting of blood in your stool or on the toilet paper.  IF YOU HAD POLYPS REMOVED DURING THE COLONOSCOPY:  No aspirin products for 7 days or as instructed.   No alcohol for 7 days or as instructed.   Eat a soft diet for the next 24 hours.  FINDING OUT THE RESULTS OF YOUR TEST Not all test results are available during your visit. If your test results are not back during the visit, make an appointment with your caregiver to find out the results. Do not assume everything is normal if  you have not heard from your caregiver or the medical facility. It is important for you to follow up on all of your test results.  SEEK IMMEDIATE MEDICAL ATTENTION IF:  You have more than a spotting of blood in your stool.   Your belly is swollen (abdominal distention).   You are nauseated or vomiting.   You have a temperature over 101.   You have abdominal pain or discomfort that is severe or gets worse throughout the day.   You have a tight stricture on the left side your colon.  I was unable to advance colonoscope through this tightening.  I switched to the ultraslim colonoscope and was still unable to advance past it.  Unclear etiology of the stricture.  I think the next best step would be to get you scheduled for a CT colonoscopy which is a virtual colonoscopy to further evaluate.  My office will call you this week to set this up.  Further recommendations to follow  I hope you have a great rest of your week!  Hennie Duos. Marletta Lor, D.O. Gastroenterology and Hepatology [facility]

## 2021-05-17 NOTE — Op Note (Signed)
[facility] Patient Name: David Parks Procedure Date: 05/17/2021 12:19 PM MRN: [id number] Date of Birth: [birth year] Attending MD: Elon Alas. Abbey Chatters DO CSN: [id number] Age: 65 Admit Type: Outpatient Procedure:                Colonoscopy Indications:              Positive Cologuard test Providers:                Elon Alas. Abbey Chatters, DO, Charlsie Quest. Theda Sers RN, RN,                            Aram Candela Referring MD:              Medicines:                See the Anesthesia note for documentation of the                            administered medications Complications:            No immediate complications. Estimated Blood Loss:     Estimated blood loss was minimal. Procedure:                Pre-Anesthesia Assessment:                           - The anesthesia plan was to use monitored                            anesthesia care (MAC).                           After obtaining informed consent, the colonoscope                            was passed under direct vision. Throughout the                            procedure, the patient's blood pressure, pulse, and                            oxygen saturations were monitored continuously. The                            [id number]-HQ190L ([id number]) scope was introduced through                            the anus with the intention of advancing to the                            cecum. The scope was advanced to the descending                            colon before the procedure was aborted. Medications                            were given. The colonoscopy was  technically                            difficult and complex due to bowel stenosis. The                            patient tolerated the procedure well. The quality                            of the bowel preparation was excellent. Scope In: 12:34:37 PM Scope Out: 12:52:47 PM Total Procedure Duration: 0 hours 18 minutes 10 seconds  Findings:      The perianal and digital rectal  examinations were normal.      Non-bleeding internal hemorrhoids were found during endoscopy.      Multiple small-mouthed diverticula were found in the sigmoid colon and       descending colon.      A benign-appearing, intrinsic severe stenosis was found in the       sigmoid/descending colon approx 40 cm from anal verge and was       not-traversed. Scope was exchanged to ultra slim colonoscope and was       still unable to pass through stricture. Procedure was then aborted. Impression:               - Non-bleeding internal hemorrhoids.                           - Diverticulosis in the sigmoid colon and in the                            descending colon.                           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                           - No specimens collected. Moderate Sedation:      Per Anesthesia Care Recommendation:           - Patient has a contact number available for                            emergencies. The signs and symptoms of potential                            delayed complications were discussed with the                            patient. Return to normal activities tomorrow.                            Written discharge instructions were provided to the                            patient.                           - Resume previous diet.                           -  Continue present medications.                           - CT colonography Procedure Code(s):        --- Professional ---                           [phone], 53, Colonoscopy, flexible; diagnostic,                            including collection of specimen(s) by brushing or                            washing, when performed (separate procedure) Diagnosis Code(s):        --- Professional ---                           K64.8, Other hemorrhoids                           K56.699, Other intestinal obstruction unspecified                            as to partial versus complete obstruction                            R19.5, Other fecal abnormalities                           K57.30, Diverticulosis of large intestine without                            perforation or abscess without bleeding CPT copyright 2019 American Medical Association. All rights reserved. The codes documented in this report are preliminary and upon coder review may  be revised to meet current compliance requirements. Elon Alas. Abbey Chatters, DO [place] Abbey Chatters, DO 05/17/2021 12:59:33 PM This report has been signed electronically. Number of Addenda: 0

## 2021-05-17 NOTE — Transfer of Care (Signed)
Immediate Anesthesia Transfer of Care Note  Patient: David Parks  Procedure(s) Performed: FLEXIBLE SIGMOIDOSCOPY  Patient Location: Endoscopy Unit  Anesthesia Type:General  Level of Consciousness: awake, alert , oriented and patient cooperative  Airway & Oxygen Therapy: Patient Spontanous Breathing  Post-op Assessment: Report given to RN, Post -op Vital signs reviewed and stable and Patient moving all extremities  Post vital signs: Reviewed and stable  Last Vitals:  Vitals Value Taken Time  BP    Temp    Pulse    Resp    SpO2      Last Pain:  Vitals:   05/17/21 1224  TempSrc:   PainSc: 1       Patients Stated Pain Goal: 9 (05/17/21 1148)  Complications: No complications documented.

## 2021-05-17 NOTE — Anesthesia Preprocedure Evaluation (Addendum)
Anesthesia Evaluation  Patient identified by MRN, date of birth, ID band Patient awake    Reviewed: Allergy & Precautions, NPO status , Patient's Chart, lab work & pertinent test results  Airway Mallampati: II  TM Distance: >3 FB Neck ROM: Full    Dental  (+) Dental Advisory Given, Loose, Missing,    Pulmonary asthma , Current Smoker and Patient abstained from smoking.,    Pulmonary exam normal breath sounds clear to auscultation       Cardiovascular Exercise Tolerance: Good hypertension, Pt. on medications Normal cardiovascular exam Rhythm:Regular Rate:Normal     Neuro/Psych    GI/Hepatic PUD, (+)     substance abuse (rarely)  alcohol use,   Endo/Other  negative endocrine ROS  Renal/GU      Musculoskeletal negative musculoskeletal ROS (+)   Abdominal   Peds  Hematology negative hematology ROS (+)   Anesthesia Other Findings Near fainting spells  Reproductive/Obstetrics negative OB ROS                             Anesthesia Physical Anesthesia Plan  ASA: III  Anesthesia Plan: General   Post-op Pain Management:    Induction:   PONV Risk Score and Plan: Propofol infusion  Airway Management Planned: Natural Airway and Nasal Cannula  Additional Equipment:   Intra-op Plan:   Post-operative Plan:   Informed Consent: I have reviewed the patients History and Physical, chart, labs and discussed the procedure including the risks, benefits and alternatives for the proposed anesthesia with the patient or authorized representative who has indicated his/her understanding and acceptance.     Dental advisory given  Plan Discussed with: CRNA and Surgeon  Anesthesia Plan Comments:        Anesthesia Quick Evaluation

## 2021-05-17 NOTE — Telephone Encounter (Signed)
Tammy at [facility] endo called office, pt needs CT virtual colonoscopy. Dr. Marletta Lor was unable to complete TCS d/t stricture in descending colon.  PA submitted for CT virtual colonoscopy via [employer] website. Case went to clinical review. Clinical notes uploaded to case. Tracking# [id number].

## 2021-05-17 NOTE — Anesthesia Postprocedure Evaluation (Signed)
Anesthesia Post Note  Patient: David Parks  Procedure(s) Performed: FLEXIBLE SIGMOIDOSCOPY  Patient location during evaluation: Endoscopy Anesthesia Type: General Level of consciousness: awake and alert and oriented Pain management: pain level controlled Vital Signs Assessment: post-procedure vital signs reviewed and stable Respiratory status: spontaneous breathing and respiratory function stable Cardiovascular status: blood pressure returned to baseline and stable Postop Assessment: no apparent nausea or vomiting Anesthetic complications: no   No complications documented.   Last Vitals:  Vitals:   05/17/21 1148 05/17/21 1255  BP: (!) 146/82   Pulse: (!) 102   Resp: 13   Temp: 37.1 C 37.1 C  SpO2: 100%     Last Pain:  Vitals:   05/17/21 1255  TempSrc: Oral  PainSc: 0-No pain                 Brittin Janik C Jnai [place]

## 2021-05-21 NOTE — Telephone Encounter (Signed)
PA pending

## 2021-05-24 ENCOUNTER — Other Ambulatory Visit: Payer: Self-pay

## 2021-05-24 DIAGNOSIS — K56699 Other intestinal obstruction unspecified as to partial versus complete obstruction: Secondary | ICD-10-CM

## 2021-05-24 DIAGNOSIS — R195 Other fecal abnormalities: Secondary | ICD-10-CM

## 2021-05-24 DIAGNOSIS — Z1211 Encounter for screening for malignant neoplasm of colon: Secondary | ICD-10-CM

## 2021-05-24 NOTE — Telephone Encounter (Signed)
CT virtual colonoscopy approved. [employer]# [id number], valid 05/18/21-06/17/21.

## 2021-05-24 NOTE — Telephone Encounter (Signed)
Order entered for CT virtual colonoscopy. [facility] will call pt to schedule appt.

## 2021-05-25 ENCOUNTER — Encounter (HOSPITAL_COMMUNITY): Payer: Self-pay | Admitting: Internal Medicine

## 2021-06-03 NOTE — Telephone Encounter (Signed)
Called [facility] to f/u on virtual colonoscopy scheduling. They left message for pt 05/25/21 but he hasn't called back.  Called pt, he said no one had called him from [facility]. Gave him phone# to [facility] to schedule appt for CT virtual colonoscopy.

## 2021-06-07 NOTE — Telephone Encounter (Signed)
Virtual colonoscopy scheduled for 07/02/21. PA expires 06/17/21.  [facility], spoke to [place], date of service updated. [employer]#[id number], valid 07/02/21-08/01/21.

## 2021-07-02 ENCOUNTER — Ambulatory Visit
Admission: RE | Admit: 2021-07-02 | Discharge: 2021-07-02 | Disposition: A | Discharge status: Home / Self Care | Payer: Medicare HMO | Source: Ambulatory Visit | Attending: Internal Medicine | Admitting: Internal Medicine

## 2021-07-02 DIAGNOSIS — K56699 Other intestinal obstruction unspecified as to partial versus complete obstruction: Secondary | ICD-10-CM

## 2021-07-02 DIAGNOSIS — R195 Other fecal abnormalities: Secondary | ICD-10-CM

## 2021-07-07 ENCOUNTER — Other Ambulatory Visit: Payer: Self-pay | Admitting: Family Medicine

## 2021-07-07 ENCOUNTER — Other Ambulatory Visit: Payer: Self-pay | Admitting: *Deleted

## 2021-07-07 DIAGNOSIS — K4090 Unilateral inguinal hernia, without obstruction or gangrene, not specified as recurrent: Secondary | ICD-10-CM

## 2021-07-13 ENCOUNTER — Ambulatory Visit (INDEPENDENT_AMBULATORY_CARE_PROVIDER_SITE_OTHER): Payer: Medicare HMO | Admitting: Gastroenterology

## 2021-07-15 ENCOUNTER — Other Ambulatory Visit (INDEPENDENT_AMBULATORY_CARE_PROVIDER_SITE_OTHER): Payer: Self-pay

## 2021-07-15 ENCOUNTER — Encounter (INDEPENDENT_AMBULATORY_CARE_PROVIDER_SITE_OTHER): Payer: Self-pay | Admitting: Gastroenterology

## 2021-07-15 ENCOUNTER — Ambulatory Visit (INDEPENDENT_AMBULATORY_CARE_PROVIDER_SITE_OTHER): Payer: Medicare HMO | Admitting: Gastroenterology

## 2021-07-15 ENCOUNTER — Other Ambulatory Visit: Payer: Self-pay

## 2021-07-15 VITALS — BP 108/66 | HR 83 | Temp 98.4°F | Ht 74.0 in | Wt 241.0 lb

## 2021-07-15 DIAGNOSIS — K8050 Calculus of bile duct without cholangitis or cholecystitis without obstruction: Secondary | ICD-10-CM

## 2021-07-15 DIAGNOSIS — K4090 Unilateral inguinal hernia, without obstruction or gangrene, not specified as recurrent: Secondary | ICD-10-CM | POA: Diagnosis not present

## 2021-07-15 DIAGNOSIS — Z8619 Personal history of other infectious and parasitic diseases: Secondary | ICD-10-CM

## 2021-07-15 NOTE — Progress Notes (Addendum)
Primary Care Physician:  Denyce Robert, FNP Primary Gastroenterologist:  Dr. Abbey Chatters  Chief Complaint  Patient presents with   bile duct stones    Pt states he has one stool almost daily, sometimes 2 a day eats one meal a day. Gas and bloating once in awhile   HPI:   David Parks is a 65 y.o. male with history of GERD, HTN, HLD, previous Hepatitis B infection, presenting today at the request of Dr. Abbey Chatters to setup ERCP for choledocolithiasis.  Patient had colonoscopy in June 2022 because of positive cologuard. Colonoscopy revealed stricture of descending colon and was unable to be completed at that time, virtual colonoscopy was performed thereafter for further evaluation, revealing cholelithiasis as well as choledocolithiasis. Also noted to have left inguinal hernia containing loop of sigmoid colon.  Patient will need ERCP to remove CBD stones prior to having cholecystectomy (General Surgery) as well as repeat colonoscopy (Dr. Abbey Chatters). Patient denies RUQ pain, R shoulder pain, back pain,  jaundice, fevers, chills, nausea or vomiting.   Personal GI history:Patient reports that he thinks he had Hep B in 1980, associated jaundice at that time. He is unsure of his current Hepatitis status  Family GI History:mother had cholecystitis and cholecystectomy  Last colonoscopy: (05/17/21)Non-bleeding internal hemorrhoids. - Diverticulosis in the sigmoid colon and in the descending colon. 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 - No specimens collected.  Virtual colonoscopy: 07/02/21 left inguinal hernia containing loop of sigmoid colon, colon remains decompressed but not able to be evaluated on study. Cholelithiasis. Probably small stones w/in cystic duct and CBD. May need further eval with MRCP or ERCP.  Daily smoker, 61 pack year Denies Alcohol use Denies illicit drug use  Past Medical History:  Diagnosis Date   Asthma    as a child   HTN (hypertension)    Hyperlipidemia    Insomnia     PUD (peptic ulcer disease)    Per patient, EGD in the 90s at [facility] with ulcer    Past Surgical History:  Procedure Laterality Date   BACK SURGERY     x2   ESOPHAGOGASTRODUODENOSCOPY     Per patient, EGD in the 90s at [facility] with ulcer.   FLEXIBLE SIGMOIDOSCOPY  05/17/2021   Procedure: FLEXIBLE SIGMOIDOSCOPY;  Surgeon: Eloise Harman, DO;  Location: [facility] SUITE;  Service: Endoscopy;;    Current Outpatient Medications  Medication Sig Dispense Refill   aspirin EC 81 MG tablet Take 81 mg by mouth in the morning. Swallow whole.     Cholecalciferol (VITAMIN D3) 50 MCG (2000 UT) TABS Take by mouth in the morning.     hydrochlorothiazide (HYDRODIURIL) 25 MG tablet Take 25 mg by mouth in the morning.     Lactobacillus (ACIDOPHILUS) CAPS capsule Take 1 capsule by mouth daily with supper.     lovastatin (MEVACOR) 40 MG tablet Take 40 mg by mouth every evening.     meclizine (ANTIVERT) 25 MG tablet Take 25 mg by mouth at bedtime as needed (sleep).     melatonin 5 MG TABS Take 10 mg by mouth at bedtime.     Omega-3 Fatty Acids (FISH OIL) 1000 MG CAPS Take 1,000 mg by mouth daily with supper.     traZODone (DESYREL) 100 MG tablet Take 100 mg by mouth at bedtime as needed for sleep.     No current facility-administered medications for this visit.    Allergies as of 07/15/2021 - Review Complete 07/15/2021  Allergen Reaction Noted  Codeine Other (See Comments) 05/08/2014   Tetracyclines & related Other (See Comments) 05/08/2014    Family History  Problem Relation Age of Onset   Heart failure Mother    Heart failure Brother    Colon cancer Neg Hx     Social History   Socioeconomic History   Marital status: Single    Spouse name: Not on file   Number of children: Not on file   Years of education: Not on file   Highest education level: Not on file  Occupational History   Not on file  Tobacco Use   Smoking status: Every Day    Types: Cigarettes   Smokeless tobacco: Never   Substance and Sexual Activity   Alcohol use: Yes    Comment: rarely. maybe at christmas   Drug use: Never   Sexual activity: Not on file  Other Topics Concern   Not on file  Social History Narrative   Not on file   Social Determinants of Health   Financial Resource Strain: Not on file  Food Insecurity: Not on file  Transportation Needs: Not on file  Physical Activity: Not on file  Stress: Not on file  Social Connections: Not on file  Intimate Partner Violence: Not on file   Review of Systems: Gen: Denies any fever, chills, fatigue, weight loss, lack of appetite.  CV: Denies chest pain, heart palpitations, peripheral edema, syncope.  Resp: Denies shortness of breath at rest or with exertion. Denies wheezing or cough.  GI: Denies dysphagia or odynophagia. Denies jaundice, hematemesis, fecal incontinence. GU : Denies urinary burning, urinary frequency, urinary hesitancy MS: Denies joint pain, muscle weakness, cramps, or limitation of movement.  Derm: Denies rash, itching, dry skin Psych: Denies depression, anxiety, memory loss, and confusion Heme: Denies bruising, bleeding, and enlarged lymph nodes.  Physical Exam: BP 108/66 (BP Location: Left Arm, Patient Position: Sitting, Cuff Size: Large)   Pulse 83   Temp 98.4 F (36.9 C) (Oral)   Ht $R'6\' 2"'oX$  (1.88 m)   Wt 241 lb (109.3 kg)   BMI 30.94 kg/m  General:   Alert and oriented. Pleasant and cooperative. Well-nourished and well-developed.  Head:  Normocephalic and atraumatic. Eyes:  Without icterus, sclera clear and conjunctiva pink.  Lungs:  Clear to auscultation bilaterally. No wheezes, rales, or rhonchi. No distress.  Heart:  S1, S2 present without murmurs appreciated.  Abdomen:  +BS, soft, non-tender and non-distended. No guarding or rebound. No masses appreciated. L inguinal hernia present, no pain at site.  Rectal:  Deferred  Msk:  Symmetrical without gross deformities. Normal posture. Extremities:  Without  edema. Neurologic:  Alert and  oriented x4;  grossly normal neurologically. Skin:  Intact without significant lesions or rashes. No jaundice present Psych:  Alert and cooperative. Normal mood and affect.  ASSESSMENT: David Parks is a 65 y.o. male presenting today to schedule ERCP for recent findings of choledocolithiasis on reccent virtual colonoscopy.   Patient denies any RUQ, R back or R shoulder pain, nausea, vomiting, jaundice or fevers. No recent LFTs. Will check CBC, CMP, INR today. Patient advised to let us know if he develops RUQ pain, nausea, vomiting or fever.   Patient also reports history of Hepatitis B in the past, unsure of current hepatitis status and unable to locate any records regarding this. Will check Hep A antibody, Hep B antigen/antibody, and Hep C antibody.  Previous history of elevated Alk Phos in 2015 (206), appears to have normalized thereafter, no obvious focal  liver abnormalities noted on virtual colonoscopy.   He will follow up with Dr. Abbey Chatters after ERCP to schedule repeat colonoscopy.   PLAN: Will check CBC CMP, INR  Will check Hep b antigen/antibody and hep c antibody Schedule for ERCP with Dr. Laural Golden Patient to meet with general surgery regarding cholecystectomy and inguinal hernia repair Follow up with Dr. Abbey Chatters 1 week after ERCP, repeat colonoscopy will need to be scheduled as well.  Takes $Remo'81mg'ZHGWG$  aspirin daily, hold asa 48 hours prior to ERCP  Indications, risks and benefits of procedure (to include ERCP induced pancreatitis) discussed in detail with patient. Patient verbalized understanding and is in agreement to proceed with ERCP at this time.   Case discussed with Dr. Laural Golden who is in agreement with plan of care, as outlined above.   Jordynne Mccown L. Alver Sorrow, MSN, APRN, AGNP-C Adult-Gerontology Nurse Practitioner [facility]   GI attending note. Patient was interviewed and examined along with Ms. Alcide Clever,  NP. Agree with plan as outlined.

## 2021-07-15 NOTE — Patient Instructions (Signed)
-  We will order labs to check your liver function tests and hepatitis panels  -We will schedule you for ERCP to remove stones from your common bile duct -If you begin having pain in your Right upper abdomen, fevers, nausea or vomiting, please lets [place] know. -Hold your aspirin 48 hours prior to the procedure  Follow up with Dr. Marletta Lor after ERCP

## 2021-07-16 ENCOUNTER — Telehealth: Payer: Self-pay | Admitting: *Deleted

## 2021-07-16 ENCOUNTER — Encounter (INDEPENDENT_AMBULATORY_CARE_PROVIDER_SITE_OTHER): Payer: Self-pay

## 2021-07-16 LAB — CBC
HCT: 47.6 % (ref 38.5–50.0)
Hemoglobin: 16.4 g/dL (ref 13.2–17.1)
MCH: 32.7 pg (ref 27.0–33.0)
MCHC: 34.5 g/dL (ref 32.0–36.0)
MCV: 94.8 fL (ref 80.0–100.0)
MPV: 9.8 fL (ref 7.5–12.5)
Platelets: 187 10*3/uL (ref 140–400)
RBC: 5.02 10*6/uL (ref 4.20–5.80)
RDW: 13.1 % (ref 11.0–15.0)
WBC: 5.2 10*3/uL (ref 3.8–10.8)

## 2021-07-16 LAB — COMPREHENSIVE METABOLIC PANEL
AG Ratio: 2 (calc) (ref 1.0–2.5)
ALT: 20 U/L (ref 9–46)
AST: 19 U/L (ref 10–35)
Albumin: 4.4 g/dL (ref 3.6–5.1)
Alkaline phosphatase (APISO): 45 U/L (ref 35–144)
BUN: 7 mg/dL (ref 7–25)
CO2: 26 mmol/L (ref 20–32)
Calcium: 9.1 mg/dL (ref 8.6–10.3)
Chloride: 104 mmol/L (ref 98–110)
Creat: 0.91 mg/dL (ref 0.70–1.35)
Globulin: 2.2 g/dL (calc) (ref 1.9–3.7)
Glucose, Bld: 90 mg/dL (ref 65–139)
Potassium: 3.7 mmol/L (ref 3.5–5.3)
Sodium: 140 mmol/L (ref 135–146)
Total Bilirubin: 0.6 mg/dL (ref 0.2–1.2)
Total Protein: 6.6 g/dL (ref 6.1–8.1)

## 2021-07-16 LAB — HEPATITIS C ANTIBODY
Hepatitis C Ab: NONREACTIVE
SIGNAL TO CUT-OFF: 0.01 (ref <1.00)

## 2021-07-16 LAB — HEPATITIS B SURFACE ANTIGEN: Hepatitis B Surface Ag: NONREACTIVE

## 2021-07-16 LAB — HEPATITIS A ANTIBODY, TOTAL: Hepatitis A AB,Total: REACTIVE — AB

## 2021-07-16 LAB — HEPATITIS B SURFACE ANTIBODY,QUALITATIVE: Hep B S Ab: REACTIVE — AB

## 2021-07-16 LAB — PROTIME-INR
INR: 1
Prothrombin Time: 10.3 s (ref 9.0–11.5)

## 2021-07-16 NOTE — Telephone Encounter (Signed)
Patient scheduled for ERCP on 8/17 with Dr. Karilyn Cota. He said he needs an OV with Dr. Marletta Lor 1 week after this. Please advise Dr. Marletta Lor when pt is due to f/u or if he needs a follow up? thanks

## 2021-07-19 NOTE — Telephone Encounter (Signed)
On recall  °

## 2021-07-20 ENCOUNTER — Telehealth (INDEPENDENT_AMBULATORY_CARE_PROVIDER_SITE_OTHER): Payer: Self-pay | Admitting: *Deleted

## 2021-07-20 NOTE — Telephone Encounter (Signed)
Pt is scheduled for ERCP on 8/17 and Dr. Karilyn Cota wanted pt to see Dr. Marletta Lor one week after ERCP.

## 2021-07-20 NOTE — Patient Instructions (Signed)
David Parks  07/20/2021     @PREFPERIOPPHARMACY @   Your procedure is scheduled on 07/28/2021.   Report to 07/30/2021 at  0830 A.M.   Call this number if you have problems the morning of surgery:  [phone]   Remember:  Do not eat or drink after midnight.      Take these medicines the morning of surgery with A SIP OF WATER                                         None     Do not wear jewelry, make-up or nail polish.  Do not wear lotions, powders, or perfumes, or deodorant.  Do not shave 48 hours prior to surgery.  Men may shave face and neck.  Do not bring valuables to the hospital.  [facility] is not responsible for any belongings or valuables.  Contacts, dentures or bridgework may not be worn into surgery.  Leave your suitcase in the car.  After surgery it may be brought to your room.  For patients admitted to the hospital, discharge time will be determined by your treatment team.  Patients discharged the day of surgery will not be allowed to drive home and must have someone with them for 24 hours.    Special instructions:   DO NOT smoke tobacco or vape for 24 hours before your procedure.  Please read over the following fact sheets that you were given. Anesthesia Post-op Instructions and Care and Recovery After Surgery      Endoscopic Retrograde Cholangiopancreatogram, Care After This sheet gives you information about how to care for yourself after your procedure. Your health care provider may also give you more specific instructions. If you have problems or questions, contact your health careprovider. What can I expect after the procedure? After the procedure, it is common to have: Soreness in your throat. Nausea. Bloating. Dizziness. Tiredness (fatigue). Follow these instructions at home:  Take over-the-counter and prescription medicines only as told by your health care provider. If you were prescribed an antibiotic medicine, take it as told  by your health care provider. Do not stop using the antibiotic even if you start to feel better. If you were given a sedative during the procedure, it can affect you for several hours. Do not drive or operate machinery until your health care provider says that it is safe. Have someone stay with you for 24 hours after the procedure. Return to your normal activities as told by your health care provider. Ask your health care provider what activities are safe for you. Return to eating what you normally do as soon as you feel well enough or as told by your health care provider. Keep all follow-up visits as told by your health care provider. This is important. Contact a health care provider if you: Have pain in your abdomen that does not get better with medicine. Develop signs of infection, such as: Chills or fever. Feeling unwell. Get help right away if you: Have difficulty swallowing. Have worsening pain in your throat, chest, or abdomen. Vomit bright red blood or a substance that looks like coffee grounds. Have bloody or black, tarry stools. Have a fever. Have a sudden increase in swelling (bloating) in your abdomen. Summary After the procedure, it is common to feel tired, and to have some discomfort in your throat or some  bloating of your abdomen. If you were given a sedative during the procedure, it can affect you for several hours. Do not drive or operate machinery until your health care provider says that it is safe. Have someone stay with you for 24 hours after the procedure. Contact your health care provider if you have signs of infection, such as chills, fever, feeling unwell, or if you have pain that does not improve with medicine. Get help right away if you have trouble swallowing, worsening pain, bloody or black vomit, bloody or black stools, a fever, or increased swelling in your abdomen. This information is not intended to replace advice given to you by your health care provider. Make  sure you discuss any questions you have with your healthcare provider. Document Revised: 08/13/2019 Document Reviewed: 08/13/2019 Elsevier Patient Education  2022 [employer] General Anesthesia, Adult, Care After This sheet gives you information about how to care for yourself after your procedure. Your health care provider may also give you more specific instructions. If you have problems or questions, contact your health careprovider. What can I expect after the procedure? After the procedure, the following side effects are common: Pain or discomfort at the IV site. Nausea. Vomiting. Sore throat. Trouble concentrating. Feeling cold or chills. Feeling weak or tired. Sleepiness and fatigue. Soreness and body aches. These side effects can affect parts of the body that were not involved in surgery. Follow these instructions at home: For the time period you were told by your health care provider:  Rest. Do not participate in activities where you could fall or become injured. Do not drive or use machinery. Do not drink alcohol. Do not take sleeping pills or medicines that cause drowsiness. Do not make important decisions or sign legal documents. Do not take care of children on your own.  Eating and drinking Follow any instructions from your health care provider about eating or drinking restrictions. When you feel hungry, start by eating small amounts of foods that are soft and easy to digest (bland), such as toast. Gradually return to your regular diet. Drink enough fluid to keep your urine pale yellow. If you vomit, rehydrate by drinking water, juice, or clear broth. General instructions If you have sleep apnea, surgery and certain medicines can increase your risk for breathing problems. Follow instructions from your health care provider about wearing your sleep device: Anytime you are sleeping, including during daytime naps. While taking prescription pain medicines, sleeping  medicines, or medicines that make you drowsy. Have a responsible adult stay with you for the time you are told. It is important to have someone help care for you until you are awake and alert. Return to your normal activities as told by your health care provider. Ask your health care provider what activities are safe for you. Take over-the-counter and prescription medicines only as told by your health care provider. If you smoke, do not smoke without supervision. Keep all follow-up visits as told by your health care provider. This is important. Contact a health care provider if: You have nausea or vomiting that does not get better with medicine. You cannot eat or drink without vomiting. You have pain that does not get better with medicine. You are unable to pass urine. You develop a skin rash. You have a fever. You have redness around your IV site that gets worse. Get help right away if: You have difficulty breathing. You have chest pain. You have blood in your urine or stool, or you vomit  blood. Summary After the procedure, it is common to have a sore throat or nausea. It is also common to feel tired. Have a responsible adult stay with you for the time you are told. It is important to have someone help care for you until you are awake and alert. When you feel hungry, start by eating small amounts of foods that are soft and easy to digest (bland), such as toast. Gradually return to your regular diet. Drink enough fluid to keep your urine pale yellow. Return to your normal activities as told by your health care provider. Ask your health care provider what activities are safe for you. This information is not intended to replace advice given to you by your health care provider. Make sure you discuss any questions you have with your healthcare provider. Document Revised: 08/13/2020 Document Reviewed: 03/12/2020 Elsevier Patient Education  2022 [employer].

## 2021-07-22 ENCOUNTER — Telehealth: Payer: Self-pay | Admitting: Internal Medicine

## 2021-07-22 NOTE — Telephone Encounter (Signed)
I phoned and spoke with the pt and he stated he's not in any pain or discomfort. The pt said he was told to scheduled a one week follow-up with you after his ERCP. He was advised regarding of your schedule being out 2 to 3 months and advised Darl Pikes he needed a appt sooner. I asked him again was he in pain or any other symptoms and he stated no. I advised the pt that I would pass along to you and once you get with me I would call him. He stated ok

## 2021-07-22 NOTE — Telephone Encounter (Signed)
Patient needs to speak with nurse. He said he needed to schedule OV with Dr Marletta Lor after his ERCP with Dr Karilyn Cota. I told him that Dr Marletta Lor wanted to see him in 2-3 months and I have him on the recall list to schedule when Dr Queen Blossom schedule is available. He is saying he needed to be seen sooner. Please advise. [phone]

## 2021-07-26 ENCOUNTER — Encounter (HOSPITAL_COMMUNITY)
Admission: RE | Admit: 2021-07-26 | Discharge: 2021-07-26 | Disposition: A | Discharge status: Home / Self Care | Payer: Medicare HMO | Source: Ambulatory Visit | Attending: Internal Medicine | Admitting: Internal Medicine

## 2021-07-26 ENCOUNTER — Other Ambulatory Visit: Payer: Self-pay

## 2021-07-26 ENCOUNTER — Encounter (HOSPITAL_COMMUNITY): Payer: Self-pay

## 2021-07-26 DIAGNOSIS — K8050 Calculus of bile duct without cholangitis or cholecystitis without obstruction: Secondary | ICD-10-CM | POA: Diagnosis not present

## 2021-07-26 DIAGNOSIS — Z01818 Encounter for other preprocedural examination: Secondary | ICD-10-CM | POA: Diagnosis present

## 2021-07-26 NOTE — Telephone Encounter (Signed)
Dr. Marletta Lor states ok to schedule with his next available appt.

## 2021-07-26 NOTE — Telephone Encounter (Signed)
noted 

## 2021-07-27 ENCOUNTER — Ambulatory Visit (INDEPENDENT_AMBULATORY_CARE_PROVIDER_SITE_OTHER): Payer: Medicare HMO | Admitting: General Surgery

## 2021-07-27 ENCOUNTER — Encounter: Payer: Self-pay | Admitting: General Surgery

## 2021-07-27 VITALS — BP 131/80 | HR 76 | Temp 98.8°F | Resp 14 | Ht 74.0 in | Wt 239.0 lb

## 2021-07-27 DIAGNOSIS — K4090 Unilateral inguinal hernia, without obstruction or gangrene, not specified as recurrent: Secondary | ICD-10-CM

## 2021-07-28 ENCOUNTER — Other Ambulatory Visit: Payer: Self-pay

## 2021-07-28 ENCOUNTER — Encounter (HOSPITAL_COMMUNITY)
Admission: RE | Disposition: A | Discharge status: Home / Self Care | Payer: Self-pay | Source: Home / Self Care | Attending: Internal Medicine

## 2021-07-28 ENCOUNTER — Ambulatory Visit (HOSPITAL_COMMUNITY): Payer: Medicare HMO | Admitting: Certified Registered"

## 2021-07-28 ENCOUNTER — Encounter (HOSPITAL_COMMUNITY): Payer: Self-pay | Admitting: Internal Medicine

## 2021-07-28 ENCOUNTER — Ambulatory Visit (HOSPITAL_COMMUNITY): Payer: Medicare HMO

## 2021-07-28 ENCOUNTER — Ambulatory Visit (HOSPITAL_COMMUNITY)
Admission: RE | Admit: 2021-07-28 | Discharge: 2021-07-28 | Disposition: A | Discharge status: Home / Self Care | Payer: Medicare HMO | Attending: Internal Medicine | Admitting: Internal Medicine

## 2021-07-28 DIAGNOSIS — Z885 Allergy status to narcotic agent status: Secondary | ICD-10-CM | POA: Insufficient documentation

## 2021-07-28 DIAGNOSIS — K8051 Calculus of bile duct without cholangitis or cholecystitis with obstruction: Secondary | ICD-10-CM | POA: Insufficient documentation

## 2021-07-28 DIAGNOSIS — Z7982 Long term (current) use of aspirin: Secondary | ICD-10-CM | POA: Insufficient documentation

## 2021-07-28 DIAGNOSIS — R195 Other fecal abnormalities: Secondary | ICD-10-CM | POA: Insufficient documentation

## 2021-07-28 DIAGNOSIS — K8050 Calculus of bile duct without cholangitis or cholecystitis without obstruction: Secondary | ICD-10-CM

## 2021-07-28 DIAGNOSIS — K8020 Calculus of gallbladder without cholecystitis without obstruction: Secondary | ICD-10-CM

## 2021-07-28 DIAGNOSIS — Z79899 Other long term (current) drug therapy: Secondary | ICD-10-CM | POA: Diagnosis not present

## 2021-07-28 DIAGNOSIS — F17210 Nicotine dependence, cigarettes, uncomplicated: Secondary | ICD-10-CM | POA: Diagnosis not present

## 2021-07-28 DIAGNOSIS — K4090 Unilateral inguinal hernia, without obstruction or gangrene, not specified as recurrent: Secondary | ICD-10-CM | POA: Diagnosis not present

## 2021-07-28 HISTORY — PX: REMOVAL OF STONES: SHX5545

## 2021-07-28 HISTORY — PX: ERCP: SHX5425

## 2021-07-28 HISTORY — PX: SPHINCTEROTOMY: SHX5279

## 2021-07-28 SURGERY — ERCP, WITH INTERVENTION IF INDICATED
Anesthesia: General

## 2021-07-28 MED ORDER — DEXAMETHASONE SODIUM PHOSPHATE 10 MG/ML IJ SOLN
INTRAMUSCULAR | Status: DC | PRN
  Administered 2021-07-28: 6 mg via INTRAVENOUS

## 2021-07-28 MED ORDER — CEFAZOLIN SODIUM-DEXTROSE 2-4 GM/100ML-% IV SOLN
2.0000 g | INTRAVENOUS | Status: AC
  Administered 2021-07-28: 2 g via INTRAVENOUS

## 2021-07-28 MED ORDER — GLYCOPYRROLATE PF 0.2 MG/ML IJ SOSY
PREFILLED_SYRINGE | INTRAMUSCULAR | Status: AC
  Filled 2021-07-28: qty 1

## 2021-07-28 MED ORDER — SUGAMMADEX SODIUM 200 MG/2ML IV SOLN
INTRAVENOUS | Status: DC | PRN
  Administered 2021-07-28: 400 mg via INTRAVENOUS

## 2021-07-28 MED ORDER — LIDOCAINE 2% (20 MG/ML) 5 ML SYRINGE
INTRAMUSCULAR | Status: DC | PRN
  Administered 2021-07-28: 100 mg via INTRAVENOUS

## 2021-07-28 MED ORDER — MEPERIDINE HCL 50 MG/ML IJ SOLN: 6.2500 mg | INTRAMUSCULAR | Status: DC | PRN

## 2021-07-28 MED ORDER — FENTANYL CITRATE (PF) 100 MCG/2ML IJ SOLN
INTRAMUSCULAR | Status: AC
  Filled 2021-07-28: qty 2

## 2021-07-28 MED ORDER — GLUCAGON HCL RDNA (DIAGNOSTIC) 1 MG IJ SOLR
INTRAMUSCULAR | Status: DC | PRN
  Administered 2021-07-28: .25 mg via INTRAVENOUS

## 2021-07-28 MED ORDER — MIDAZOLAM HCL 5 MG/5ML IJ SOLN
INTRAMUSCULAR | Status: DC | PRN
  Administered 2021-07-28: 2 mg via INTRAVENOUS

## 2021-07-28 MED ORDER — ROCURONIUM BROMIDE 10 MG/ML (PF) SYRINGE
PREFILLED_SYRINGE | INTRAVENOUS | Status: AC
  Filled 2021-07-28: qty 10

## 2021-07-28 MED ORDER — ORAL CARE MOUTH RINSE: 15.0000 mL | Freq: Once | OROMUCOSAL | Status: AC

## 2021-07-28 MED ORDER — GLUCAGON HCL RDNA (DIAGNOSTIC) 1 MG IJ SOLR
INTRAMUSCULAR | Status: AC
  Filled 2021-07-28: qty 1

## 2021-07-28 MED ORDER — PHENYLEPHRINE 40 MCG/ML (10ML) SYRINGE FOR IV PUSH (FOR BLOOD PRESSURE SUPPORT)
PREFILLED_SYRINGE | INTRAVENOUS | Status: AC
  Filled 2021-07-28: qty 10

## 2021-07-28 MED ORDER — ROCURONIUM BROMIDE 10 MG/ML (PF) SYRINGE
PREFILLED_SYRINGE | INTRAVENOUS | Status: DC | PRN
  Administered 2021-07-28: 60 mg via INTRAVENOUS

## 2021-07-28 MED ORDER — CHLORHEXIDINE GLUCONATE 0.12 % MT SOLN
OROMUCOSAL | Status: AC
  Filled 2021-07-28: qty 15

## 2021-07-28 MED ORDER — SODIUM CHLORIDE 0.9 % IV SOLN
INTRAVENOUS | Status: AC
  Filled 2021-07-28: qty 100

## 2021-07-28 MED ORDER — LIDOCAINE HCL (PF) 2 % IJ SOLN
INTRAMUSCULAR | Status: AC
  Filled 2021-07-28: qty 5

## 2021-07-28 MED ORDER — ONDANSETRON HCL 4 MG/2ML IJ SOLN: 4.0000 mg | Freq: Once | INTRAMUSCULAR | Status: DC | PRN

## 2021-07-28 MED ORDER — MIDAZOLAM HCL 2 MG/2ML IJ SOLN
INTRAMUSCULAR | Status: AC
  Filled 2021-07-28: qty 2

## 2021-07-28 MED ORDER — FENTANYL CITRATE (PF) 100 MCG/2ML IJ SOLN
INTRAMUSCULAR | Status: DC | PRN
  Administered 2021-07-28 (×2): 50 ug via INTRAVENOUS

## 2021-07-28 MED ORDER — ONDANSETRON HCL 4 MG/2ML IJ SOLN
INTRAMUSCULAR | Status: DC | PRN
  Administered 2021-07-28: 4 mg via INTRAVENOUS

## 2021-07-28 MED ORDER — PROPOFOL 10 MG/ML IV BOLUS
INTRAVENOUS | Status: AC
  Filled 2021-07-28: qty 40

## 2021-07-28 MED ORDER — STERILE WATER FOR IRRIGATION IR SOLN
Status: DC | PRN
  Administered 2021-07-28: 1000 mL

## 2021-07-28 MED ORDER — LACTATED RINGERS IV SOLN: INTRAVENOUS | Status: DC

## 2021-07-28 MED ORDER — PHENYLEPHRINE 40 MCG/ML (10ML) SYRINGE FOR IV PUSH (FOR BLOOD PRESSURE SUPPORT)
PREFILLED_SYRINGE | INTRAVENOUS | Status: DC | PRN
  Administered 2021-07-28: 80 ug via INTRAVENOUS

## 2021-07-28 MED ORDER — SODIUM CHLORIDE 0.9 % IV SOLN
INTRAVENOUS | Status: DC | PRN
  Administered 2021-07-28: 20 mL

## 2021-07-28 MED ORDER — FENTANYL CITRATE PF 50 MCG/ML IJ SOSY: 25.0000 ug | PREFILLED_SYRINGE | INTRAMUSCULAR | Status: DC | PRN

## 2021-07-28 MED ORDER — CEFAZOLIN SODIUM-DEXTROSE 2-4 GM/100ML-% IV SOLN
INTRAVENOUS | Status: AC
  Filled 2021-07-28: qty 100

## 2021-07-28 MED ORDER — PROPOFOL 10 MG/ML IV BOLUS
INTRAVENOUS | Status: DC | PRN
  Administered 2021-07-28: 200 mg via INTRAVENOUS

## 2021-07-28 MED ORDER — ONDANSETRON HCL 4 MG/2ML IJ SOLN
INTRAMUSCULAR | Status: AC
  Filled 2021-07-28: qty 2

## 2021-07-28 MED ORDER — GLYCOPYRROLATE PF 0.2 MG/ML IJ SOSY
PREFILLED_SYRINGE | INTRAMUSCULAR | Status: DC | PRN
  Administered 2021-07-28: .1 mg via INTRAVENOUS

## 2021-07-28 MED ORDER — DEXAMETHASONE SODIUM PHOSPHATE 10 MG/ML IJ SOLN
INTRAMUSCULAR | Status: AC
  Filled 2021-07-28: qty 1

## 2021-07-28 MED ORDER — CHLORHEXIDINE GLUCONATE 0.12 % MT SOLN
15.0000 mL | Freq: Once | OROMUCOSAL | Status: AC
  Administered 2021-07-28: 15 mL via OROMUCOSAL

## 2021-07-28 SURGICAL SUPPLY — 25 items
BALLN RETRIEVAL 12X15 (BALLOONS) IMPLANT
BALN RTRVL 200 6-7FR 12-15 (BALLOONS)
BASKET TRAPEZOID 3X6 (MISCELLANEOUS) IMPLANT
BASKET TRAPEZOID LITHO 2.0X5 (MISCELLANEOUS) IMPLANT
BSKT STON RTRVL TRAPEZOID 2X5 (MISCELLANEOUS)
BSKT STON RTRVL TRAPEZOID 3X6 (MISCELLANEOUS)
DEVICE INFLATION ENCORE 26 (MISCELLANEOUS) IMPLANT
DEVICE LOCKING W-BIOPSY CAP (MISCELLANEOUS) ×2 IMPLANT
GUIDEWIRE HYDRA JAGWIRE .35 (WIRE) IMPLANT
GUIDEWIRE JAG HINI 025X260CM (WIRE) IMPLANT
KIT ENDO PROCEDURE PEN (KITS) ×2 IMPLANT
KIT TURNOVER KIT A (KITS) ×2 IMPLANT
LUBRICANT JELLY 4.5OZ STERILE (MISCELLANEOUS) IMPLANT
PAD ARMBOARD 7.5X6 YLW CONV (MISCELLANEOUS) ×2 IMPLANT
POSITIONER HEAD 8X9X4 ADT (SOFTGOODS) IMPLANT
SCOPE SPY DS DISPOSABLE (MISCELLANEOUS) IMPLANT
SNARE ROTATE MED OVAL 20MM (MISCELLANEOUS) IMPLANT
SNARE SHORT THROW 13M SML OVAL (MISCELLANEOUS) IMPLANT
SPHINCTEROTOME AUTOTOME .25 (MISCELLANEOUS) IMPLANT
SPHINCTEROTOME HYDRATOME 44 (MISCELLANEOUS) IMPLANT
SYSTEM CONTINUOUS INJECTION (MISCELLANEOUS) IMPLANT
TUBING INSUFFLATOR CO2MPACT (TUBING) ×2 IMPLANT
WALLSTENT METAL COVERED 10X60 (STENTS) IMPLANT
WALLSTENT METAL COVERED 10X80 (STENTS) IMPLANT
WATER STERILE IRR 1000ML POUR (IV SOLUTION) ×2 IMPLANT

## 2021-07-28 NOTE — Progress Notes (Signed)
Brief ERCP note.  Normal limited view of upper GI tract. Normal-appearing ampulla of Vater with short intramural segment. CBD cannulated without any difficulty. Cholangiogram revealed mildly dilated CBD and CHD with 3 filling defects. To filling defects appeared to be in the cystic duct along with multiple stones in the gallbladder. Biliary sphincterotomy performed and 3 black/pigmented stones were removed with stone balloon extractor. Pancreatic duct was was cannulated once but not filled with contrast.  Patient tolerated the procedure well.

## 2021-07-28 NOTE — Anesthesia Postprocedure Evaluation (Signed)
Anesthesia Post Note  Patient: David Parks  Procedure(s) Performed: ENDOSCOPIC RETROGRADE CHOLANGIOPANCREATOGRAPHY (ERCP) REMOVAL OF STONES SPHINCTEROTOMY  Patient location during evaluation: PACU Anesthesia Type: General Level of consciousness: awake and alert and oriented Pain management: pain level controlled Vital Signs Assessment: post-procedure vital signs reviewed and stable Respiratory status: spontaneous breathing and respiratory function stable Cardiovascular status: blood pressure returned to baseline and stable Postop Assessment: no apparent nausea or vomiting Anesthetic complications: no   No notable events documented.   Last Vitals:  Vitals:   07/28/21 1215 07/28/21 1226  BP: 140/79 (!) 153/91  Pulse: 85 80  Resp: 18 18  Temp:  37 C  SpO2: 100% 96%    Last Pain:  Vitals:   07/28/21 1226  TempSrc: Oral  PainSc: 0-No pain                 Ezequiel Macauley C Shakaya Bhullar

## 2021-07-28 NOTE — Progress Notes (Signed)
David Parks; [id number]; 1956/06/11   HPI Patient is a 65 year old white male who was referred to my care by Dr. Marletta Lor for evaluation and treatment of a left inguinal hernia.  Patient was noted to have a left inguinal hernia on recent examination.  He currently is pretty much asymptomatic and was only made recently aware that he had a hernia.  He is undergoing an ERCP for choledocholithiasis by Dr. Karilyn Cota shortly.  He states he may be care for a cholecystectomy. Past Medical History:  Diagnosis Date   Asthma    as a child   History of hepatitis B    HTN (hypertension)    Hyperlipidemia    Insomnia    PUD (peptic ulcer disease)    Per patient, EGD in the 90s at [facility] with ulcer    Past Surgical History:  Procedure Laterality Date   BACK SURGERY     x2   ESOPHAGOGASTRODUODENOSCOPY     Per patient, EGD in the 90s at [facility] with ulcer.   FLEXIBLE SIGMOIDOSCOPY  05/17/2021   carver:Non-bleeding internal hemorrhoids. diverticulosis in sigmoid and descending colon, stricture in descending colon, no specimens collected. will need repeat colonoscopy after inguinal hernia repair    Family History  Problem Relation Age of Onset   Heart failure Mother    Heart failure Brother    Colon cancer Neg Hx     Current Facility-Administered Medications on File Prior to Visit  Medication Dose Route Frequency Provider Last Rate Last Admin   ceFAZolin (ANCEF) 2-4 GM/100ML-% IVPB            ceFAZolin (ANCEF) IVPB 2g/100 mL premix  2 g Intravenous On Call to OR Rehman, Joline Maxcy, MD       chlorhexidine (PERIDEX) 0.12 % solution            lactated ringers infusion   Intravenous Continuous Molli Barrows, MD 50 mL/hr at 07/28/21 0920 New Bag at 07/28/21 0920   Current Outpatient Medications on File Prior to Visit  Medication Sig Dispense Refill   aspirin EC 81 MG tablet Take 81 mg by mouth in the morning. Swallow whole.     Cholecalciferol (VITAMIN D3) 50 MCG (2000 UT) TABS Take 2,000 Units  by mouth in the morning.     hydrochlorothiazide (HYDRODIURIL) 25 MG tablet Take 25 mg by mouth in the morning.     lovastatin (MEVACOR) 40 MG tablet Take 40 mg by mouth every evening.     Melatonin 10 MG TABS Take 10 mg by mouth at bedtime.     Omega-3 Fatty Acids (FISH OIL) 1000 MG CAPS Take 1,000 mg by mouth in the morning and at bedtime.     traZODone (DESYREL) 100 MG tablet Take 150 mg by mouth at bedtime.      Allergies  Allergen Reactions   Codeine Other (See Comments)    Swelling of the tongue and can not swallow    Tetracyclines & Related Other (See Comments)    Swelling of the tongue and can not swallow     Social History   Substance and Sexual Activity  Alcohol Use Yes   Comment: rarely. maybe at [employer]    Social History   Tobacco Use  Smoking Status Every Day   Packs/day: 1.00   Years: 60.00   Pack years: 60.00   Types: Cigarettes  Smokeless Tobacco Never    Review of Systems  Constitutional: Negative.   HENT:  Positive for sinus pain.  Eyes: Negative.   Respiratory: Negative.    Cardiovascular: Negative.   Gastrointestinal:  Positive for heartburn.  Genitourinary: Negative.   Musculoskeletal: Negative.   Skin: Negative.   Neurological: Negative.   Endo/Heme/Allergies: Negative.   Psychiatric/Behavioral: Negative.     Objective   Vitals:   07/27/21 1306  BP: 131/80  Pulse: 76  Resp: 14  Temp: 98.8 F (37.1 C)  SpO2: 93%    Physical Exam Vitals reviewed.  Constitutional:      Appearance: Normal appearance. He is obese. He is not ill-appearing.  HENT:     Head: Normocephalic and atraumatic.  Cardiovascular:     Rate and Rhythm: Normal rate and regular rhythm.     Heart sounds: Normal heart sounds. No murmur heard.   No friction rub. No gallop.  Pulmonary:     Effort: Pulmonary effort is normal. No respiratory distress.     Breath sounds: Normal breath sounds. No stridor. No wheezing, rhonchi or rales.  Abdominal:     General:  Bowel sounds are normal. There is no distension.     Palpations: Abdomen is soft. There is no mass.     Tenderness: There is no abdominal tenderness. There is no guarding or rebound.     Hernia: A hernia is present.     Comments: Moderate sized reducible left inguinal hernia.  Weak right inguinal floor.  Genitourinary:    Testes: Normal.  Skin:    General: Skin is warm and dry.  Neurological:     Mental Status: He is alert and oriented to person, place, and time.    Assessment  Left inguinal hernia, currently asymptomatic Choledocholithiasis currently being treated by Dr. Karilyn Cota Plan  No need for acute surgical intervention for the left inguinal hernia at this time.  Patient's risk of incarceration is low.  He states that he would like to avoid surgery if at all possible.  Literature was given.  Further management is pending results of ERCP.

## 2021-07-28 NOTE — Op Note (Signed)
[facility] Patient Name: David Parks Procedure Date: 07/28/2021 10:31 AM MRN: [id number] Date of Birth: [birth year] Attending MD: Lionel December , MD CSN: [id number] Age: 65 Admit Type: Outpatient Procedure:                ERCP Indications:              For therapy of bile duct stone(s) Providers:                Lionel December, MD, Brain Hilts, RN, Burke Keels, Technician Referring MD:             Bryon Lions. Marletta Lor, DO and [place], [place], FNP Medicines:                General Anesthesia Complications:            No immediate complications Estimated Blood Loss:     Estimated blood loss: none. Procedure:                Pre-Anesthesia Assessment:                           - Prior to the procedure, a History and Physical                            was performed, and patient medications and                            allergies were reviewed. The patient's tolerance of                            previous anesthesia was also reviewed. The risks                            and benefits of the procedure and the sedation                            options and risks were discussed with the patient.                            All questions were answered, and informed consent                            was obtained. Prior Anticoagulants: The patient has                            taken no previous anticoagulant or antiplatelet                            agents except for aspirin. ASA Grade Assessment: II                            - A patient with mild systemic disease. After  reviewing the risks and benefits, the patient was                            deemed in satisfactory condition to undergo the                            procedure.                           After obtaining informed consent, the scope was                            passed under direct vision. Throughout the                            procedure, the patient's blood  pressure, pulse, and                            oxygen saturations were monitored continuously. The                            [employer] D single use                            duodenoscope was introduced through the mouth, and                            used to inject contrast into and used to inject                            contrast into the bile duct. The ERCP was                            accomplished without difficulty. The patient                            tolerated the procedure well. Scope In: 11:01:00 AM Scope Out: 11:15:38 AM Total Procedure Duration: 0 hours 14 minutes 38 seconds  Findings:      The scout film was normal. The esophagus was successfully intubated       under direct vision without detailed examination of the pharynx, larynx,       and associated structures. The esophagus was successfully intubated       under direct vision without detailed examination of the pharynx. The       upper GI tract was grossly normal. The major papilla was normal. The       bile duct was deeply cannulated with the Autotome sphincterotome.       Contrast was injected. I personally interpreted the bile duct images.       There was brisk flow of contrast through the ducts. Image quality was       excellent. Contrast extended to the entire biliary tree. The common bile       duct and common hepatic duct were mildly dilated and diffusely dilated,       with 3 filling defects i.e. stones in  CBD.. The largest diameter was 5       mm. The common bile duct contained three stones, the largest of which       was 5 mm in diameter. The biliary sphincterotomy was extended to a total       of 7 mm in length with a braided Ibudone sphincterotome using ERBE       electrocautery. There was no post-sphincterotomy bleeding. The biliary       tree was swept with a 9 mm balloon starting just below the bifurcation.       Three stones were removed. No stones remained. 2 filling defects  in       proximal common hepatic duct did not move and were felt to be in the       cystic duct which was patent and multiple stones were noted in the       gallbladder.      PD was cannulated once but not filled with contrast Impression:               - The major papilla appeared normal.                           - The common bile duct and common hepatic duct were                            mildly dilated with 3 stones.                           - A biliary sphincterotomy was performed and all 3                            stones were removed with stone balloon extractor.                           Comment: Pictures of ampulla, sphincterotomy and                            stones were captured but lost. Moderate Sedation:      Per Anesthesia Care Recommendation:           - Discharge patient to home (with escort).                           - Clear liquid diet today.                           - Resume previous diet starting tomorrow morning..                           - Avoid aspirin and anticoagulants for 3 days.                           - Follow-up with Dr. Franky MachoMark Jenkins for                            cholecystectomy. Procedure Code(s):        --- Professional ---  [phone], Endoscopic retrograde                            cholangiopancreatography (ERCP); with removal of                            calculi/debris from biliary/pancreatic duct(s)                           43262, Endoscopic retrograde                            cholangiopancreatography (ERCP); with                            sphincterotomy/papillotomy Diagnosis Code(s):        --- Professional ---                           K80.51, Calculus of bile duct without cholangitis                            or cholecystitis with obstruction CPT copyright 2019 American Medical Association. All rights reserved. The codes documented in this report are preliminary and upon coder review may  be revised to meet  current compliance requirements. Lionel December, MD Lionel December, MD 07/28/2021 11:48:00 AM This report has been signed electronically. Number of Addenda: 0

## 2021-07-28 NOTE — Transfer of Care (Signed)
Immediate Anesthesia Transfer of Care Note  Patient: David Parks  Procedure(s) Performed: ENDOSCOPIC RETROGRADE CHOLANGIOPANCREATOGRAPHY (ERCP) REMOVAL OF STONES SPHINCTEROTOMY  Patient Location: PACU  Anesthesia Type:General  Level of Consciousness: drowsy  Airway & Oxygen Therapy: Patient Spontanous Breathing and Patient connected to face mask oxygen  Post-op Assessment: Report given to RN and Post -op Vital signs reviewed and stable  Post vital signs: Reviewed and stable  Last Vitals:  Vitals Value Taken Time  BP    Temp    Pulse    Resp 15 07/28/21 1129  SpO2    Vitals shown include unvalidated device data.  Last Pain:  Vitals:   07/28/21 0850  PainSc: 0-No pain      Patients Stated Pain Goal: 5 (07/28/21 0850)  Complications: No notable events documented.

## 2021-07-28 NOTE — Anesthesia Preprocedure Evaluation (Signed)
Anesthesia Evaluation  Patient identified by MRN, date of birth, ID band Patient awake    Reviewed: Allergy & Precautions, NPO status , Patient's Chart, lab work & pertinent test results  History of Anesthesia Complications Negative for: history of anesthetic complications  Airway Mallampati: II  TM Distance: >3 FB Neck ROM: Full    Dental  (+) Dental Advisory Given, Missing, Loose, Chipped, Poor Dentition,    Pulmonary asthma , Current SmokerPatient did not abstain from smoking.,    Pulmonary exam normal breath sounds clear to auscultation       Cardiovascular Exercise Tolerance: Good hypertension, Pt. on medications Normal cardiovascular exam Rhythm:Regular Rate:Normal  26-Jul-2021 13:49:08 [facility]-AP-300 ROUTINE RECORD 02/15/1956 (2 yr) Male Caucasian Room: Loc:903 Technician: Test ind: Vent. rate 80 BPM PR interval 188 ms QRS duration 186 ms QT/QTcB 452/521 ms P-R-T axes 60 -34 80 Normal sinus rhythm Left axis deviation Left bundle branch block Abnormal ECG   Neuro/Psych negative psych ROS   GI/Hepatic PUD, (+) Hepatitis -, B  Endo/Other  negative endocrine ROS  Renal/GU negative Renal ROS     Musculoskeletal negative musculoskeletal ROS (+)   Abdominal   Peds  Hematology negative hematology ROS (+)   Anesthesia Other Findings Near fainting spells   Reproductive/Obstetrics negative OB ROS                             Anesthesia Physical Anesthesia Plan  ASA: 3  Anesthesia Plan: General   Post-op Pain Management:    Induction: Intravenous  PONV Risk Score and Plan: 2 and Ondansetron and Dexamethasone  Airway Management Planned: Oral ETT  Additional Equipment:   Intra-op Plan:   Post-operative Plan: Extubation in OR  Informed Consent: I have reviewed the patients History and Physical, chart, labs and discussed the procedure including the  risks, benefits and alternatives for the proposed anesthesia with the patient or authorized representative who has indicated his/her understanding and acceptance.       Plan Discussed with: CRNA and Surgeon  Anesthesia Plan Comments:         Anesthesia Quick Evaluation

## 2021-07-28 NOTE — Anesthesia Procedure Notes (Signed)
Procedure Name: Intubation Date/Time: 07/28/2021 10:50 AM Performed by: Julian Reil, CRNA Pre-anesthesia Checklist: Patient identified, Emergency Drugs available, Suction available and Patient being monitored Patient Re-evaluated:Patient Re-evaluated prior to induction Oxygen Delivery Method: Circle system utilized Preoxygenation: Pre-oxygenation with 100% oxygen Induction Type: IV induction Ventilation: Mask ventilation without difficulty Laryngoscope Size: Miller and 3 Grade View: Grade II Tube type: Oral Tube size: 7.5 mm Number of attempts: 1 Airway Equipment and Method: Stylet Placement Confirmation: ETT inserted through vocal cords under direct vision, positive ETCO2 and breath sounds checked- equal and bilateral Secured at: 23 cm Tube secured with: Tape Dental Injury: Teeth and Oropharynx as per pre-operative assessment  Comments: Poor condition of teeth noted Pre-op.  Many missing on top, lower right front tooth loose.

## 2021-07-28 NOTE — H&P (Signed)
David Parks is an 65 y.o. male.   Chief Complaint: Patient is here for ERCP. HPI: Patient is 65 year old Caucasian male who underwent diagnostic colonoscopy because of positive Cologuard test.  Colonoscopy was incomplete secondary to left inguinal hernia.  Dr. Marletta Lor therefore obtained virtual colonoscopy which did not reveal any lesions in the colon but it revealed cholelithiasis as well as choledocholithiasis. Patient has had 1 or 2 episodes of mild midepigastric pain per year.  No history of chest pain shortness of breath fever or chills.  His appetite is good and he has not lost any weight.  Last aspirin dose was 5 days ago. Patient was seen in the office about 2 weeks ago and we discussed ERCP with sphincterotomy and stone extraction.  Patient agreed to proceed.   Past Medical History:  Diagnosis Date   Asthma    as a child   History of hepatitis B    HTN (hypertension)    Hyperlipidemia    Insomnia    PUD (peptic ulcer disease)    Per patient, EGD in the 90s at [facility] with ulcer    Past Surgical History:  Procedure Laterality Date   BACK SURGERY     x2   ESOPHAGOGASTRODUODENOSCOPY     Per patient, EGD in the 90s at [facility] with ulcer.   FLEXIBLE SIGMOIDOSCOPY  05/17/2021   carver:Non-bleeding internal hemorrhoids. diverticulosis in sigmoid and descending colon, stricture in descending colon, no specimens collected. will need repeat colonoscopy after inguinal hernia repair    Family History  Problem Relation Age of Onset   Heart failure Mother    Heart failure Brother    Colon cancer Neg Hx    Social History:  reports that he has been smoking cigarettes. He has a 60.00 pack-year smoking history. He has never used smokeless tobacco. He reports current alcohol use. He reports that he does not use drugs.  Allergies:  Allergies  Allergen Reactions   Codeine Other (See Comments)    Swelling of the tongue and can not swallow    Tetracyclines & Related Other (See Comments)     Swelling of the tongue and can not swallow     Medications Prior to Admission  Medication Sig Dispense Refill   aspirin EC 81 MG tablet Take 81 mg by mouth in the morning. Swallow whole.     Cholecalciferol (VITAMIN D3) 50 MCG (2000 UT) TABS Take 2,000 Units by mouth in the morning.     hydrochlorothiazide (HYDRODIURIL) 25 MG tablet Take 25 mg by mouth in the morning.     lovastatin (MEVACOR) 40 MG tablet Take 40 mg by mouth every evening.     Melatonin 10 MG TABS Take 10 mg by mouth at bedtime.     Omega-3 Fatty Acids (FISH OIL) 1000 MG CAPS Take 1,000 mg by mouth in the morning and at bedtime.     traZODone (DESYREL) 100 MG tablet Take 150 mg by mouth at bedtime.      No results found for this or any previous visit (from the past 48 hour(s)). No results found.  Review of Systems  Blood pressure (!) 149/77, pulse 77, temperature 98.2 F (36.8 C), resp. rate 16, SpO2 98 %. Physical Exam HENT:     Mouth/Throat:     Mouth: Mucous membranes are moist.     Pharynx: Oropharynx is clear.  Eyes:     General: No scleral icterus.    Conjunctiva/sclera: Conjunctivae normal.  Cardiovascular:     Rate  and Rhythm: Normal rate and regular rhythm.     Heart sounds: Normal heart sounds. No murmur heard. Pulmonary:     Effort: Pulmonary effort is normal.     Breath sounds: Normal breath sounds.  Abdominal:     Comments: Abdominal examination reveals left inguinal hernia which is completely reducible.  Abdomen is soft and nontender with organomegaly or masses.  Musculoskeletal:        General: No swelling.     Cervical back: Neck supple.  Lymphadenopathy:     Cervical: No cervical adenopathy.  Skin:    General: Skin is warm.  Neurological:     Mental Status: He is alert.     Assessment/Plan  Choledocholithiasis ERCP with sphincterotomy and stone extraction. He should also consider cholecystectomy at a later date.  David December, MD 07/28/2021, 10:34 AM

## 2021-07-28 NOTE — Discharge Instructions (Addendum)
Resume aspirin on 07/31/2021 Resume other medications as before. Clear liquids today and usual diet starting tomorrow morning. No driving for 24 hours. Follow-up with Dr. Lovell Sheehan regarding cholecystectomy.

## 2021-08-03 ENCOUNTER — Encounter (HOSPITAL_COMMUNITY): Payer: Self-pay | Admitting: Internal Medicine

## 2021-08-11 ENCOUNTER — Encounter: Payer: Self-pay | Admitting: Internal Medicine

## 2021-09-29 ENCOUNTER — Ambulatory Visit (INDEPENDENT_AMBULATORY_CARE_PROVIDER_SITE_OTHER): Payer: Medicare HMO | Admitting: Internal Medicine

## 2021-09-29 ENCOUNTER — Other Ambulatory Visit: Payer: Self-pay

## 2021-09-29 ENCOUNTER — Encounter: Payer: Self-pay | Admitting: Internal Medicine

## 2021-09-29 VITALS — BP 115/68 | HR 77 | Temp 97.1°F | Ht 74.0 in | Wt 221.4 lb

## 2021-09-29 DIAGNOSIS — R195 Other fecal abnormalities: Secondary | ICD-10-CM | POA: Diagnosis not present

## 2021-09-29 DIAGNOSIS — K8050 Calculus of bile duct without cholangitis or cholecystitis without obstruction: Secondary | ICD-10-CM | POA: Diagnosis not present

## 2021-09-29 DIAGNOSIS — K4090 Unilateral inguinal hernia, without obstruction or gangrene, not specified as recurrent: Secondary | ICD-10-CM | POA: Diagnosis not present

## 2021-09-29 NOTE — Progress Notes (Signed)
Referring Provider: Marylynn Pearson, FNP Primary Care Physician:  Marylynn Pearson, FNP Primary GI:  Dr. Marletta Lor  Chief Complaint  Patient presents with   Gas    HPI:   David Parks is a 65 y.o. male who presents to the clinic today for follow-up visit.  Initially seen previously for positive Cologuard testing.  Colonoscopy attempted but aborted due to tight sigmoid turn/stricture.    He subsequently underwent CT colonography which was a nondiagnostic study due to decompression of there colon proximal to sigmoid colon which was contained in an inguinal hernia.  Did make mention of cholelithiasis as well as choledocholithiasis.  He subsequently underwent ERCP 07/28/2021 with multiple stones removed.  Today he states he is doing well.  Having some gas since his ERCP but otherwise doing well.  Past Medical History:  Diagnosis Date   Asthma    as a child   History of hepatitis B    HTN (hypertension)    Hyperlipidemia    Insomnia    PUD (peptic ulcer disease)    Per patient, EGD in the 90s at [facility] with ulcer    Past Surgical History:  Procedure Laterality Date   BACK SURGERY     x2   ERCP N/A 07/28/2021   Procedure: ENDOSCOPIC RETROGRADE CHOLANGIOPANCREATOGRAPHY (ERCP);  Surgeon: Malissa Hippo, MD;  Location: [facility];  Service: Endoscopy;  Laterality: N/A;   ESOPHAGOGASTRODUODENOSCOPY     Per patient, EGD in the 90s at [facility] with ulcer.   FLEXIBLE SIGMOIDOSCOPY  05/17/2021   Janelly Switalski:Non-bleeding internal hemorrhoids. diverticulosis in sigmoid and descending colon, stricture in descending colon, no specimens collected. will need repeat colonoscopy after inguinal hernia repair   REMOVAL OF STONES N/A 07/28/2021   Procedure: REMOVAL OF STONES;  Surgeon: Malissa Hippo, MD;  Location: [facility];  Service: Endoscopy;  Laterality: N/A;   SPHINCTEROTOMY N/A 07/28/2021   Procedure: SPHINCTEROTOMY;  Surgeon: Malissa Hippo, MD;  Location: [facility];  Service: Endoscopy;   Laterality: N/A;    Current Outpatient Medications  Medication Sig Dispense Refill   aspirin EC 81 MG tablet Take 1 tablet (81 mg total) by mouth in the morning. Swallow whole. 30 tablet 11   Aspirin Effervescent (ALKA-SELTZER PO) Take by mouth as needed.     Cholecalciferol (VITAMIN D3) 50 MCG (2000 UT) TABS Take 2,000 Units by mouth in the morning.     hydrochlorothiazide (HYDRODIURIL) 25 MG tablet Take 25 mg by mouth in the morning.     lovastatin (MEVACOR) 40 MG tablet Take 40 mg by mouth every evening.     Melatonin 10 MG TABS Take 10 mg by mouth at bedtime.     Omega-3 Fatty Acids (FISH OIL) 1000 MG CAPS Take 1,000 mg by mouth in the morning and at bedtime.     Probiotic Product (PROBIOTIC DAILY PO) Take by mouth daily.     traZODone (DESYREL) 100 MG tablet Take 150 mg by mouth at bedtime.     No current facility-administered medications for this visit.    Allergies as of 09/29/2021 - Review Complete 09/29/2021  Allergen Reaction Noted   Codeine Other (See Comments) 05/08/2014   Tetracyclines & related Other (See Comments) 05/08/2014    Family History  Problem Relation Age of Onset   Heart failure Mother    Heart failure Brother    Colon cancer Neg Hx     Social History   Socioeconomic History   Marital status: Single    Spouse  name: Not on file   Number of children: Not on file   Years of education: Not on file   Highest education level: Not on file  Occupational History   Not on file  Tobacco Use   Smoking status: Every Day    Packs/day: 1.00    Years: 60.00    Pack years: 60.00    Types: Cigarettes   Smokeless tobacco: Never  Vaping Use   Vaping Use: Never used  Substance and Sexual Activity   Alcohol use: Yes    Comment: rarely. maybe at christmas   Drug use: Never   Sexual activity: Yes  Other Topics Concern   Not on file  Social History Narrative   Not on file   Social Determinants of Health   Financial Resource Strain: Not on file  Food  Insecurity: Not on file  Transportation Needs: Not on file  Physical Activity: Not on file  Stress: Not on file  Social Connections: Not on file    Subjective: Review of Systems  Constitutional:  Negative for chills and fever.  HENT:  Negative for congestion and hearing loss.   Eyes:  Negative for blurred vision and double vision.  Respiratory:  Negative for cough and shortness of breath.   Cardiovascular:  Negative for chest pain and palpitations.  Gastrointestinal:  Negative for abdominal pain, blood in stool, constipation, diarrhea, heartburn, melena and vomiting.  Genitourinary:  Negative for dysuria and urgency.  Musculoskeletal:  Negative for joint pain and myalgias.  Skin:  Negative for itching and rash.  Neurological:  Negative for dizziness and headaches.  Psychiatric/Behavioral:  Negative for depression. The patient is not nervous/anxious.     Objective: BP 115/68   Pulse 77   Temp (!) 97.1 F (36.2 C) (Temporal)   Ht 6\' 2"  (1.88 m)   Wt 221 lb 6.4 oz (100.4 kg)   BMI 28.43 kg/m  Physical Exam Constitutional:      Appearance: Normal appearance.  HENT:     Head: Normocephalic and atraumatic.  Eyes:     Extraocular Movements: Extraocular movements intact.     Conjunctiva/sclera: Conjunctivae normal.  Cardiovascular:     Rate and Rhythm: Normal rate and regular rhythm.  Pulmonary:     Effort: Pulmonary effort is normal.     Breath sounds: Normal breath sounds.  Abdominal:     General: Bowel sounds are normal.     Palpations: Abdomen is soft.  Musculoskeletal:        General: Normal range of motion.     Cervical back: Normal range of motion and neck supple.  Skin:    General: Skin is warm.  Neurological:     General: No focal deficit present.     Mental Status: He is alert and oriented to person, place, and time.  Psychiatric:        Mood and Affect: Mood normal.        Behavior: Behavior normal.     Assessment: *Choledocholithiasis *Positive  Cologuard testing *Inguinal hernia  Plan: Discussed case with Dr. who agrees patient needs cholecystectomy.  Patient would like to hold off until January to have this done.  I have sent a message to Dr. February office to schedule office appointment for him in January to discuss cholecystectomy.  Given his positive Cologuard testing he still needs a repeat colonoscopy as previous was aborted due to tight sigmoid turn/stricture.  He has an inguinal hernia which could be contributing to his difficult colonoscopy.  Patient to discuss possible inguinal hernia repair as well with Dr. Lovell Sheehan.  We will have patient follow-up with GI in 4 months to discuss repeat colonoscopy.  09/29/2021 2:25 PM   Disclaimer: This note was dictated with voice recognition software. Similar sounding words can inadvertently be transcribed and may not be corrected upon review.

## 2021-09-29 NOTE — Patient Instructions (Signed)
I will reach out to Dr. Lovell Sheehan to discuss scheduling you for gallbladder removal surgery.  He may elect to repair your hernia at the same time.  Either his office or my office will be in touch in regards to this.  Otherwise follow-up with me in 4 months.  It was nice seeing you again today.  Dr. Marletta Lor  At [facility] we value your feedback. You may receive a survey about your visit today. Please share your experience as we strive to create trusting relationships with our patients to provide genuine, compassionate, quality care.  We appreciate your understanding and patience as we review any laboratory studies, imaging, and other diagnostic tests that are ordered as we care for you. Our office policy is 5 business days for review of these results, and any emergent or urgent results are addressed in a timely manner for your best interest. If you do not hear from our office in 1 week, please contact us.   We also encourage the use of MyChart, which contains your medical information for your review as well. If you are not enrolled in this feature, an access code is on this after visit summary for your convenience. Thank you for allowing [place] to be involved in your care.  It was great to see you today!  I hope you have a great rest of your Fall!    Hennie Duos. Marletta Lor, D.O. Gastroenterology and Hepatology [facility]

## 2021-11-16 ENCOUNTER — Encounter: Payer: Self-pay | Admitting: General Surgery

## 2021-11-16 ENCOUNTER — Other Ambulatory Visit: Payer: Self-pay

## 2021-11-16 ENCOUNTER — Ambulatory Visit (INDEPENDENT_AMBULATORY_CARE_PROVIDER_SITE_OTHER): Payer: Medicare HMO | Admitting: General Surgery

## 2021-11-16 VITALS — BP 128/80 | HR 84 | Temp 98.2°F | Resp 12 | Ht 74.0 in | Wt 215.0 lb

## 2021-11-16 DIAGNOSIS — K8020 Calculus of gallbladder without cholecystitis without obstruction: Secondary | ICD-10-CM

## 2021-11-16 NOTE — Progress Notes (Signed)
Subjective:     David Parks  Patient presents back to discuss 2 surgeries.  He needs a laparoscopic cholecystectomy due to a history of cholelithiasis and choledocholithiasis, status post ERCP with stone extraction in August of this year.  In addition, he has a large left inguinal hernia that is occasionally symptomatic and causing discomfort when it is sticking out.  He is unable to get a colonoscopy due to colon being entrapped in the hernia when it is sticking out.  He is able to reduce it on its own.  He has had a positive Cologuard test and requires a colonoscopy at some point.  He was curious as to whether he could have both procedures done at the same time.  He denies any nausea, vomiting, or fatty food intolerance. Objective:    BP 128/80   Pulse 84   Temp 98.2 F (36.8 C) (Other (Comment))   Resp 12   Ht 6\' 2"  (1.88 m)   Wt 215 lb (97.5 kg)   SpO2 96%   BMI 27.60 kg/m   General:  alert, cooperative, and no distress  Well-developed well-nourished white male no acute distress Lungs are clear to auscultation with your breath sounds bilaterally Heart examination reveals a regular rate and rhythm without S3, S4, murmurs Abdomen is soft, nontender, nondistended.  A reducible left inguinal hernia is noted.     Assessment:    Cholelithiasis, history of choledocholithiasis.  Left inguinal hernia   Plan:  I told him that I could not do both procedures at the same time due to mesh use.  We will proceed with laparoscopic cholecystectomy with intraoperative cholangiograms on 12/31/2021.  The risks and benefits of the procedure including bleeding, infection, hepatobiliary injury, and the possibility of an open procedure were fully explained to the patient, who gave informed consent.  We will schedule left inguinal herniorrhaphy with mesh at a later date.

## 2021-12-15 NOTE — H&P (Signed)
Subjective:     David Parks  Patient presents back to discuss 2 surgeries.  He needs a laparoscopic cholecystectomy due to a history of cholelithiasis and choledocholithiasis, status post ERCP with stone extraction in August of this year.  In addition, he has a large left inguinal hernia that is occasionally symptomatic and causing discomfort when it is sticking out.  He is unable to get a colonoscopy due to colon being entrapped in the hernia when it is sticking out.  He is able to reduce it on its own.  He has had a positive Cologuard test and requires a colonoscopy at some point.  He was curious as to whether he could have both procedures done at the same time.  He denies any nausea, vomiting, or fatty food intolerance. Objective:    BP 128/80   Pulse 84   Temp 98.2 F (36.8 C) (Other (Comment))   Resp 12   Ht 6' 2" (1.88 m)   Wt 215 lb (97.5 kg)   SpO2 96%   BMI 27.60 kg/m   General:  alert, cooperative, and no distress  Well-developed well-nourished white male no acute distress Lungs are clear to auscultation with your breath sounds bilaterally Heart examination reveals a regular rate and rhythm without S3, S4, murmurs Abdomen is soft, nontender, nondistended.  A reducible left inguinal hernia is noted.     Assessment:    Cholelithiasis, history of choledocholithiasis.  Left inguinal hernia   Plan:  I told him that I could not do both procedures at the same time due to mesh use.  We will proceed with laparoscopic cholecystectomy with intraoperative cholangiograms on 12/31/2021.  The risks and benefits of the procedure including bleeding, infection, hepatobiliary injury, and the possibility of an open procedure were fully explained to the patient, who gave informed consent.  We will schedule left inguinal herniorrhaphy with mesh at a later date.  

## 2021-12-28 NOTE — Patient Instructions (Signed)
David Parks  12/28/2021     @PREFPERIOPPHARMACY @   Your procedure is scheduled on  12/31/2021.   Report to [facility] at  [phone] A.M.   Call this number if you have problems the morning of surgery:  [phone]   Remember:  Do not eat or drink after midnight.      Take these medicines the morning of surgery with A SIP OF WATER                                           None     Do not wear jewelry, make-up or nail polish.  Do not wear lotions, powders, or perfumes, or deodorant.  Do not shave 48 hours prior to surgery.  Men may shave face and neck.  Do not bring valuables to the hospital.  [facility] is not responsible for any belongings or valuables.  Contacts, dentures or bridgework may not be worn into surgery.  Leave your suitcase in the car.  After surgery it may be brought to your room.  For patients admitted to the hospital, discharge time will be determined by your treatment team.  Patients discharged the day of surgery will not be allowed to drive home and must have someone with them for 24 hours.    Special instructions:   DO NOT smoke tobacco or vape for 24 hours before your procedure.  Please read over the following fact sheets that you were given. Coughing and Deep Breathing, Surgical Site Infection Prevention, Anesthesia Post-op Instructions, and Care and Recovery After Surgery      Minimally Invasive Cholecystectomy, Care After The following information offers guidance on how to care for yourself after your procedure. Your health care provider may also give you more specific instructions. If you have problems or questions, contact your health care provider. What can I expect after the procedure? After the procedure, it is common to have: Pain at your incision sites. You will be given medicines to control this pain. Mild nausea or vomiting. Bloating and possible shoulder pain from the gas that was used during the procedure. Follow  these instructions at home: Medicines Take over-the-counter and prescription medicines only as told by your health care provider. If you were prescribed an antibiotic medicine, take it as told by your health care provider. Do not stop using the antibiotic even if you start to feel better. Ask your health care provider if the medicine prescribed to you: Requires you to avoid driving or using machinery. Can cause constipation. You may need to take these actions to prevent or treat constipation: Drink enough fluid to keep your urine pale yellow. Take over-the-counter or prescription medicines. Eat foods that are high in fiber, such as beans, whole grains, and fresh fruits and vegetables. Limit foods that are high in fat and processed sugars, such as fried or sweet foods. Incision care  Follow instructions from your health care provider about how to take care of your incisions. Make sure you: Wash your hands with soap and water for at least 20 seconds before and after you change your bandage (dressing). If soap and water are not available, use hand sanitizer. Change your dressing as told by your health care provider. Leave stitches (sutures), skin glue, or adhesive strips in place. These skin closures may need to be in place for 2  weeks or longer. If adhesive strip edges start to loosen and curl up, you may trim the loose edges. Do not remove adhesive strips completely unless your health care provider tells you to do that. Do not take baths, swim, or use a hot tub until your health care provider approves. Ask your health care provider if you may take showers. You may only be allowed to take sponge baths. Check your incision area every day for signs of infection. Check for: More redness, swelling, or pain. Fluid or blood. Warmth. Pus or a bad smell. Activity Rest as told by your health care provider. Do not do activities that require a lot of effort. Avoid sitting for a long time without moving.  Get up to take short walks every 1-2 hours. This is important to improve blood flow and breathing. Ask for help if you feel weak or unsteady. Do not lift anything that is heavier than 10 lb (4.5 kg), or the limit that you are told, until your health care provider says that it is safe. Do not play contact sports until your health care provider approves. Do not return to work or school until your health care provider approves. Return to your normal activities as told by your health care provider. Ask your health care provider what activities are safe for you. General instructions If you were given a sedative during the procedure, it can affect you for several hours. Do not drive or operate machinery until your health care provider says that it is safe. Keep all follow-up visits. This is important. Contact a health care provider if: You develop a rash. You have more redness, swelling, or pain around your incisions. You have fluid or blood coming from your incisions. Your incisions feel warm to the touch. You have pus or a bad smell coming from your incisions. You have a fever. One or more of your incisions breaks open. Get help right away if: You have trouble breathing. You have chest pain. You have more pain in your shoulders. You faint or feel dizzy when you stand. You have severe pain in your abdomen. You have nausea or vomiting that lasts for more than one day. You have leg pain that is new or unusual, or if it is localized to one specific spot. These symptoms may represent a serious problem that is an emergency. Do not wait to see if the symptoms will go away. Get medical help right away. Call your local emergency services (911 in the [place] Do not drive yourself to the hospital. Summary After your procedure, it is common to have pain at the incision sites. You may also have nausea or bloating. Follow your health care provider's instructions about medicine, activity restrictions, and  caring for your incision areas. Do not do activities that require a lot of effort. Contact a health care provider if you have a fever or other signs of infection, such as more redness, swelling, or pain around the incisions. Get help right away if you have chest pain, increasing pain in the shoulders, or trouble breathing. This information is not intended to replace advice given to you by your health care provider. Make sure you discuss any questions you have with your health care provider. Document Revised: 06/01/2021 Document Reviewed: 06/01/2021 Elsevier Patient Education  2022 [employer] General Anesthesia, Adult, Care After This sheet gives you information about how to care for yourself after your procedure. Your health care provider may also give you more specific instructions. If you have  problems or questions, contact your health care provider. What can I expect after the procedure? After the procedure, the following side effects are common: Pain or discomfort at the IV site. Nausea. Vomiting. Sore throat. Trouble concentrating. Feeling cold or chills. Feeling weak or tired. Sleepiness and fatigue. Soreness and body aches. These side effects can affect parts of the body that were not involved in surgery. Follow these instructions at home: For the time period you were told by your health care provider:  Rest. Do not participate in activities where you could fall or become injured. Do not drive or use machinery. Do not drink alcohol. Do not take sleeping pills or medicines that cause drowsiness. Do not make important decisions or sign legal documents. Do not take care of children on your own. Eating and drinking Follow any instructions from your health care provider about eating or drinking restrictions. When you feel hungry, start by eating small amounts of foods that are soft and easy to digest (bland), such as toast. Gradually return to your regular diet. Drink enough  fluid to keep your urine pale yellow. If you vomit, rehydrate by drinking water, juice, or clear broth. General instructions If you have sleep apnea, surgery and certain medicines can increase your risk for breathing problems. Follow instructions from your health care provider about wearing your sleep device: Anytime you are sleeping, including during daytime naps. While taking prescription pain medicines, sleeping medicines, or medicines that make you drowsy. Have a responsible adult stay with you for the time you are told. It is important to have someone help care for you until you are awake and alert. Return to your normal activities as told by your health care provider. Ask your health care provider what activities are safe for you. Take over-the-counter and prescription medicines only as told by your health care provider. If you smoke, do not smoke without supervision. Keep all follow-up visits as told by your health care provider. This is important. Contact a health care provider if: You have nausea or vomiting that does not get better with medicine. You cannot eat or drink without vomiting. You have pain that does not get better with medicine. You are unable to pass urine. You develop a skin rash. You have a fever. You have redness around your IV site that gets worse. Get help right away if: You have difficulty breathing. You have chest pain. You have blood in your urine or stool, or you vomit blood. Summary After the procedure, it is common to have a sore throat or nausea. It is also common to feel tired. Have a responsible adult stay with you for the time you are told. It is important to have someone help care for you until you are awake and alert. When you feel hungry, start by eating small amounts of foods that are soft and easy to digest (bland), such as toast. Gradually return to your regular diet. Drink enough fluid to keep your urine pale yellow. Return to your normal  activities as told by your health care provider. Ask your health care provider what activities are safe for you. This information is not intended to replace advice given to you by your health care provider. Make sure you discuss any questions you have with your health care provider. Document Revised: 08/13/2020 Document Reviewed: 03/12/2020 Elsevier Patient Education  2022 Else[employer] Inc. How to Use Chlorhexidine for Bathing Chlorhexidine gluconate (CHG) is a germ-killing (antiseptic) solution that is used to clean the skin. It can  get rid of the bacteria that normally live on the skin and can keep them away for about 24 hours. To clean your skin with CHG, you may be given: A CHG solution to use in the shower or as part of a sponge bath. A prepackaged cloth that contains CHG. Cleaning your skin with CHG may help lower the risk for infection: While you are staying in the intensive care unit of the hospital. If you have a vascular access, such as a central line, to provide short-term or long-term access to your veins. If you have a catheter to drain urine from your bladder. If you are on a ventilator. A ventilator is a machine that helps you breathe by moving air in and out of your lungs. After surgery. What are the risks? Risks of using CHG include: A skin reaction. Hearing loss, if CHG gets in your ears and you have a perforated eardrum. Eye injury, if CHG gets in your eyes and is not rinsed out. The CHG product catching fire. Make sure that you avoid smoking and flames after applying CHG to your skin. Do not use CHG: If you have a chlorhexidine allergy or have previously reacted to chlorhexidine. On babies younger than 43 months of age. How to use CHG solution Use CHG only as told by your health care provider, and follow the instructions on the label. Use the full amount of CHG as directed. Usually, this is one bottle. During a shower Follow these steps when using CHG solution during a  shower (unless your health care provider gives you different instructions): Start the shower. Use your normal soap and shampoo to wash your face and hair. Turn off the shower or move out of the shower stream. Pour the CHG onto a clean washcloth. Do not use any type of brush or rough-edged sponge. Starting at your neck, lather your body down to your toes. Make sure you follow these instructions: If you will be having surgery, pay special attention to the part of your body where you will be having surgery. Scrub this area for at least 1 minute. Do not use CHG on your head or face. If the solution gets into your ears or eyes, rinse them well with water. Avoid your genital area. Avoid any areas of skin that have broken skin, cuts, or scrapes. Scrub your back and under your arms. Make sure to wash skin folds. Let the lather sit on your skin for 1-2 minutes or as long as told by your health care provider. Thoroughly rinse your entire body in the shower. Make sure that all body creases and crevices are rinsed well. Dry off with a clean towel. Do not put any substances on your body afterward--such as powder, lotion, or perfume--unless you are told to do so by your health care provider. Only use lotions that are recommended by the manufacturer. Put on clean clothes or pajamas. If it is the night before your surgery, sleep in clean sheets.  During a sponge bath Follow these steps when using CHG solution during a sponge bath (unless your health care provider gives you different instructions): Use your normal soap and shampoo to wash your face and hair. Pour the CHG onto a clean washcloth. Starting at your neck, lather your body down to your toes. Make sure you follow these instructions: If you will be having surgery, pay special attention to the part of your body where you will be having surgery. Scrub this area for at least 1 minute.  Do not use CHG on your head or face. If the solution gets into your  ears or eyes, rinse them well with water. Avoid your genital area. Avoid any areas of skin that have broken skin, cuts, or scrapes. Scrub your back and under your arms. Make sure to wash skin folds. Let the lather sit on your skin for 1-2 minutes or as long as told by your health care provider. Using a different clean, wet washcloth, thoroughly rinse your entire body. Make sure that all body creases and crevices are rinsed well. Dry off with a clean towel. Do not put any substances on your body afterward--such as powder, lotion, or perfume--unless you are told to do so by your health care provider. Only use lotions that are recommended by the manufacturer. Put on clean clothes or pajamas. If it is the night before your surgery, sleep in clean sheets. How to use CHG prepackaged cloths Only use CHG cloths as told by your health care provider, and follow the instructions on the label. Use the CHG cloth on clean, dry skin. Do not use the CHG cloth on your head or face unless your health care provider tells you to. When washing with the CHG cloth: Avoid your genital area. Avoid any areas of skin that have broken skin, cuts, or scrapes. Before surgery Follow these steps when using a CHG cloth to clean before surgery (unless your health care provider gives you different instructions): Using the CHG cloth, vigorously scrub the part of your body where you will be having surgery. Scrub using a back-and-forth motion for 3 minutes. The area on your body should be completely wet with CHG when you are done scrubbing. Do not rinse. Discard the cloth and let the area air-dry. Do not put any substances on the area afterward, such as powder, lotion, or perfume. Put on clean clothes or pajamas. If it is the night before your surgery, sleep in clean sheets.  For general bathing Follow these steps when using CHG cloths for general bathing (unless your health care provider gives you different instructions). Use a  separate CHG cloth for each area of your body. Make sure you wash between any folds of skin and between your fingers and toes. Wash your body in the following order, switching to a new cloth after each step: The front of your neck, shoulders, and chest. Both of your arms, under your arms, and your hands. Your stomach and groin area, avoiding the genitals. Your right leg and foot. Your left leg and foot. The back of your neck, your back, and your buttocks. Do not rinse. Discard the cloth and let the area air-dry. Do not put any substances on your body afterward--such as powder, lotion, or perfume--unless you are told to do so by your health care provider. Only use lotions that are recommended by the manufacturer. Put on clean clothes or pajamas. Contact a health care provider if: Your skin gets irritated after scrubbing. You have questions about using your solution or cloth. You swallow any chlorhexidine. Call your local poison control center (4022943505 in the [place] Get help right away if: Your eyes itch badly, or they become very red or swollen. Your skin itches badly and is red or swollen. Your hearing changes. You have trouble seeing. You have swelling or tingling in your mouth or throat. You have trouble breathing. These symptoms may represent a serious problem that is an emergency. Do not wait to see if the symptoms will go away. Get  medical help right away. Call your local emergency services (911 in the [place] Do not drive yourself to the hospital. Summary Chlorhexidine gluconate (CHG) is a germ-killing (antiseptic) solution that is used to clean the skin. Cleaning your skin with CHG may help to lower your risk for infection. You may be given CHG to use for bathing. It may be in a bottle or in a prepackaged cloth to use on your skin. Carefully follow your health care provider's instructions and the instructions on the product label. Do not use CHG if you have a chlorhexidine  allergy. Contact your health care provider if your skin gets irritated after scrubbing. This information is not intended to replace advice given to you by your health care provider. Make sure you discuss any questions you have with your health care provider. Document Revised: 02/08/2021 Document Reviewed: 02/08/2021 Elsevier Patient Education  2022 [employer].

## 2021-12-29 ENCOUNTER — Encounter (HOSPITAL_COMMUNITY)
Admission: RE | Admit: 2021-12-29 | Discharge: 2021-12-29 | Disposition: A | Discharge status: Home / Self Care | Payer: Medicare HMO | Source: Ambulatory Visit | Attending: General Surgery | Admitting: General Surgery

## 2021-12-29 ENCOUNTER — Other Ambulatory Visit: Payer: Self-pay

## 2021-12-29 ENCOUNTER — Encounter (HOSPITAL_COMMUNITY): Payer: Self-pay

## 2021-12-29 DIAGNOSIS — Z01812 Encounter for preprocedural laboratory examination: Secondary | ICD-10-CM | POA: Insufficient documentation

## 2021-12-29 DIAGNOSIS — K8050 Calculus of bile duct without cholangitis or cholecystitis without obstruction: Secondary | ICD-10-CM | POA: Insufficient documentation

## 2021-12-29 HISTORY — DX: Other complications of anesthesia, initial encounter: T88.59XA

## 2021-12-29 LAB — CBC WITH DIFFERENTIAL/PLATELET
Abs Immature Granulocytes: 0.01 10*3/uL (ref 0.00–0.07)
Basophils Absolute: 0 10*3/uL (ref 0.0–0.1)
Basophils Relative: 1 %
Eosinophils Absolute: 0.1 10*3/uL (ref 0.0–0.5)
Eosinophils Relative: 2 %
HCT: 47.3 % (ref 39.0–52.0)
Hemoglobin: 16.7 g/dL (ref 13.0–17.0)
Immature Granulocytes: 0 %
Lymphocytes Relative: 34 %
Lymphs Abs: 1.8 10*3/uL (ref 0.7–4.0)
MCH: 33.5 pg (ref 26.0–34.0)
MCHC: 35.3 g/dL (ref 30.0–36.0)
MCV: 95 fL (ref 80.0–100.0)
Monocytes Absolute: 0.4 10*3/uL (ref 0.1–1.0)
Monocytes Relative: 8 %
Neutro Abs: 2.9 10*3/uL (ref 1.7–7.7)
Neutrophils Relative %: 55 %
Platelets: 159 10*3/uL (ref 150–400)
RBC: 4.98 MIL/uL (ref 4.22–5.81)
RDW: 14 % (ref 11.5–15.5)
WBC: 5.2 10*3/uL (ref 4.0–10.5)
nRBC: 0 % (ref 0.0–0.2)

## 2021-12-29 LAB — COMPREHENSIVE METABOLIC PANEL
ALT: 28 U/L (ref 0–44)
AST: 21 U/L (ref 15–41)
Albumin: 4.1 g/dL (ref 3.5–5.0)
Alkaline Phosphatase: 55 U/L (ref 38–126)
Anion gap: 9 (ref 5–15)
BUN: 9 mg/dL (ref 8–23)
CO2: 26 mmol/L (ref 22–32)
Calcium: 8.7 mg/dL — ABNORMAL LOW (ref 8.9–10.3)
Chloride: 103 mmol/L (ref 98–111)
Creatinine, Ser: 0.78 mg/dL (ref 0.61–1.24)
GFR, Estimated: >60 mL/min (ref >60)
Glucose, Bld: 97 mg/dL (ref 70–99)
Potassium: 3.4 mmol/L — ABNORMAL LOW (ref 3.5–5.1)
Sodium: 138 mmol/L (ref 135–145)
Total Bilirubin: 0.8 mg/dL (ref 0.3–1.2)
Total Protein: 6.6 g/dL (ref 6.5–8.1)

## 2021-12-31 ENCOUNTER — Ambulatory Visit (HOSPITAL_COMMUNITY): Payer: Medicare HMO

## 2021-12-31 ENCOUNTER — Ambulatory Visit (HOSPITAL_COMMUNITY): Payer: Medicare HMO | Admitting: Anesthesiology

## 2021-12-31 ENCOUNTER — Ambulatory Visit (HOSPITAL_COMMUNITY)
Admission: RE | Admit: 2021-12-31 | Discharge: 2021-12-31 | Disposition: A | Discharge status: Home / Self Care | Payer: Medicare HMO | Attending: General Surgery | Admitting: General Surgery

## 2021-12-31 ENCOUNTER — Encounter (HOSPITAL_COMMUNITY): Payer: Self-pay | Admitting: General Surgery

## 2021-12-31 ENCOUNTER — Encounter (HOSPITAL_COMMUNITY)
Admission: RE | Disposition: A | Discharge status: Home / Self Care | Payer: Self-pay | Source: Home / Self Care | Attending: General Surgery

## 2021-12-31 DIAGNOSIS — K4090 Unilateral inguinal hernia, without obstruction or gangrene, not specified as recurrent: Secondary | ICD-10-CM | POA: Diagnosis not present

## 2021-12-31 DIAGNOSIS — K8020 Calculus of gallbladder without cholecystitis without obstruction: Secondary | ICD-10-CM | POA: Diagnosis not present

## 2021-12-31 DIAGNOSIS — K8012 Calculus of gallbladder with acute and chronic cholecystitis without obstruction: Secondary | ICD-10-CM | POA: Diagnosis not present

## 2021-12-31 DIAGNOSIS — J45909 Unspecified asthma, uncomplicated: Secondary | ICD-10-CM | POA: Diagnosis not present

## 2021-12-31 DIAGNOSIS — Z79899 Other long term (current) drug therapy: Secondary | ICD-10-CM | POA: Insufficient documentation

## 2021-12-31 DIAGNOSIS — I10 Essential (primary) hypertension: Secondary | ICD-10-CM | POA: Insufficient documentation

## 2021-12-31 DIAGNOSIS — K828 Other specified diseases of gallbladder: Secondary | ICD-10-CM | POA: Insufficient documentation

## 2021-12-31 DIAGNOSIS — K8010 Calculus of gallbladder with chronic cholecystitis without obstruction: Secondary | ICD-10-CM | POA: Diagnosis present

## 2021-12-31 DIAGNOSIS — I447 Left bundle-branch block, unspecified: Secondary | ICD-10-CM | POA: Diagnosis not present

## 2021-12-31 DIAGNOSIS — K8050 Calculus of bile duct without cholangitis or cholecystitis without obstruction: Secondary | ICD-10-CM

## 2021-12-31 DIAGNOSIS — Z419 Encounter for procedure for purposes other than remedying health state, unspecified: Secondary | ICD-10-CM

## 2021-12-31 HISTORY — PX: CHOLECYSTECTOMY: SHX55

## 2021-12-31 SURGERY — LAPAROSCOPIC CHOLECYSTECTOMY WITH INTRAOPERATIVE CHOLANGIOGRAM
Anesthesia: General | Site: Abdomen

## 2021-12-31 MED ORDER — FENTANYL CITRATE (PF) 100 MCG/2ML IJ SOLN
INTRAMUSCULAR | Status: AC
  Filled 2021-12-31: qty 2

## 2021-12-31 MED ORDER — ROCURONIUM BROMIDE 10 MG/ML (PF) SYRINGE
PREFILLED_SYRINGE | INTRAVENOUS | Status: AC
  Filled 2021-12-31: qty 10

## 2021-12-31 MED ORDER — ONDANSETRON HCL 4 MG/2ML IJ SOLN: 4.0000 mg | Freq: Once | INTRAMUSCULAR | Status: DC | PRN

## 2021-12-31 MED ORDER — ROCURONIUM BROMIDE 100 MG/10ML IV SOLN
INTRAVENOUS | Status: DC | PRN
  Administered 2021-12-31: 50 mg via INTRAVENOUS

## 2021-12-31 MED ORDER — LIDOCAINE HCL (CARDIAC) PF 100 MG/5ML IV SOSY
PREFILLED_SYRINGE | INTRAVENOUS | Status: DC | PRN
  Administered 2021-12-31: 80 mg via INTRATRACHEAL

## 2021-12-31 MED ORDER — LIDOCAINE HCL (PF) 2 % IJ SOLN
INTRAMUSCULAR | Status: AC
  Filled 2021-12-31: qty 10

## 2021-12-31 MED ORDER — CHLORHEXIDINE GLUCONATE CLOTH 2 % EX PADS: 6.0000 | MEDICATED_PAD | Freq: Once | CUTANEOUS | Status: DC

## 2021-12-31 MED ORDER — 0.9 % SODIUM CHLORIDE (POUR BTL) OPTIME
TOPICAL | Status: DC | PRN
  Administered 2021-12-31: 1000 mL

## 2021-12-31 MED ORDER — CHLORHEXIDINE GLUCONATE 0.12 % MT SOLN
OROMUCOSAL | Status: AC
  Filled 2021-12-31: qty 15

## 2021-12-31 MED ORDER — CHLORHEXIDINE GLUCONATE 0.12 % MT SOLN
15.0000 mL | Freq: Once | OROMUCOSAL | Status: AC
  Administered 2021-12-31: 15 mL via OROMUCOSAL

## 2021-12-31 MED ORDER — SUGAMMADEX SODIUM 200 MG/2ML IV SOLN
INTRAVENOUS | Status: DC | PRN
Start: 2021-12-31 — End: 2021-12-31
  Administered 2021-12-31: 382.8 mg via INTRAVENOUS

## 2021-12-31 MED ORDER — BUPIVACAINE LIPOSOME 1.3 % IJ SUSP
INTRAMUSCULAR | Status: DC | PRN
  Administered 2021-12-31: 20 mL

## 2021-12-31 MED ORDER — CEFTRIAXONE SODIUM 2 G IJ SOLR
2.0000 g | INTRAMUSCULAR | Status: AC
  Administered 2021-12-31: 2 g via INTRAVENOUS
  Filled 2021-12-31: qty 20

## 2021-12-31 MED ORDER — ONDANSETRON HCL 4 MG/2ML IJ SOLN
INTRAMUSCULAR | Status: DC | PRN
  Administered 2021-12-31: 4 mg via INTRAVENOUS

## 2021-12-31 MED ORDER — LACTATED RINGERS IV SOLN
INTRAVENOUS | Status: DC
Start: 2021-12-31 — End: 2021-12-31
  Administered 2021-12-31: 1000 mL via INTRAVENOUS

## 2021-12-31 MED ORDER — BUPIVACAINE LIPOSOME 1.3 % IJ SUSP
INTRAMUSCULAR | Status: AC
  Filled 2021-12-31: qty 20

## 2021-12-31 MED ORDER — HEMOSTATIC AGENTS (NO CHARGE) OPTIME
TOPICAL | Status: DC | PRN
  Administered 2021-12-31 (×2): 1 via TOPICAL

## 2021-12-31 MED ORDER — FENTANYL CITRATE PF 50 MCG/ML IJ SOSY: 25.0000 ug | PREFILLED_SYRINGE | INTRAMUSCULAR | Status: DC | PRN

## 2021-12-31 MED ORDER — TRAMADOL HCL 50 MG PO TABS: 50.0000 mg | ORAL_TABLET | Freq: Four times a day (QID) | ORAL | 0 refills | Status: DC | PRN

## 2021-12-31 MED ORDER — PHENYLEPHRINE HCL (PRESSORS) 10 MG/ML IV SOLN
INTRAVENOUS | Status: DC | PRN
  Administered 2021-12-31: 80 ug via INTRAVENOUS

## 2021-12-31 MED ORDER — ORAL CARE MOUTH RINSE
15.0000 mL | Freq: Once | OROMUCOSAL | Status: AC
Start: 2021-12-31 — End: 2021-12-31

## 2021-12-31 MED ORDER — PROPOFOL 10 MG/ML IV BOLUS
INTRAVENOUS | Status: DC | PRN
  Administered 2021-12-31: 200 mg via INTRAVENOUS

## 2021-12-31 MED ORDER — PROPOFOL 10 MG/ML IV BOLUS
INTRAVENOUS | Status: AC
  Filled 2021-12-31: qty 20

## 2021-12-31 MED ORDER — DEXAMETHASONE SODIUM PHOSPHATE 10 MG/ML IJ SOLN
INTRAMUSCULAR | Status: AC
  Filled 2021-12-31: qty 2

## 2021-12-31 MED ORDER — ONDANSETRON HCL 4 MG/2ML IJ SOLN
INTRAMUSCULAR | Status: AC
  Filled 2021-12-31: qty 4

## 2021-12-31 MED ORDER — MIDAZOLAM HCL 2 MG/2ML IJ SOLN
INTRAMUSCULAR | Status: AC
  Filled 2021-12-31: qty 2

## 2021-12-31 MED ORDER — FENTANYL CITRATE (PF) 100 MCG/2ML IJ SOLN
INTRAMUSCULAR | Status: DC | PRN
  Administered 2021-12-31: 100 ug via INTRAVENOUS

## 2021-12-31 MED ORDER — MIDAZOLAM HCL 5 MG/5ML IJ SOLN
INTRAMUSCULAR | Status: DC | PRN
Start: 2021-12-31 — End: 2021-12-31
  Administered 2021-12-31: 2 mg via INTRAVENOUS

## 2021-12-31 SURGICAL SUPPLY — 42 items
APPLICATOR ARISTA FLEXITIP XL (MISCELLANEOUS) ×1 IMPLANT
APPLIER CLIP ROT 10 11.4 M/L (STAPLE) ×2
BAG RETRIEVAL 10 (BASKET) ×1
CHLORAPREP W/TINT 26 (MISCELLANEOUS) ×2 IMPLANT
CLIP APPLIE ROT 10 11.4 M/L (STAPLE) ×1 IMPLANT
CLOTH BEACON ORANGE TIMEOUT ST (SAFETY) ×2 IMPLANT
COVER LIGHT HANDLE STERIS (MISCELLANEOUS) ×4 IMPLANT
DERMABOND ADVANCED (GAUZE/BANDAGES/DRESSINGS) ×1
DERMABOND ADVANCED .7 DNX12 (GAUZE/BANDAGES/DRESSINGS) ×1 IMPLANT
DRAPE C-ARM FOLDED MOBILE STRL (DRAPES) ×2 IMPLANT
ELECT REM PT RETURN 9FT ADLT (ELECTROSURGICAL) ×2
ELECTRODE REM PT RTRN 9FT ADLT (ELECTROSURGICAL) ×1 IMPLANT
GLOVE SURG POLYISO LF SZ7.5 (GLOVE) ×2 IMPLANT
GLOVE SURG UNDER POLY LF SZ7 (GLOVE) ×6 IMPLANT
GOWN STRL REUS W/TWL LRG LVL3 (GOWN DISPOSABLE) ×6 IMPLANT
HEMOSTAT ARISTA ABSORB 1G (HEMOSTASIS) ×2 IMPLANT
HEMOSTAT SNOW SURGICEL 2X4 (HEMOSTASIS) ×2 IMPLANT
INST SET LAPROSCOPIC AP (KITS) ×2 IMPLANT
IV NS 500ML (IV SOLUTION) ×1
IV NS 500ML BAXH (IV SOLUTION) ×1 IMPLANT
KIT TURNOVER KIT A (KITS) ×2 IMPLANT
MANIFOLD NEPTUNE II (INSTRUMENTS) ×2 IMPLANT
NDL HYPO 21X1.5 SAFETY (NEEDLE) ×1 IMPLANT
NEEDLE HYPO 21X1.5 SAFETY (NEEDLE) ×2 IMPLANT
NEEDLE INSUFFLATION 120MM (ENDOMECHANICALS) ×2 IMPLANT
NS IRRIG 1000ML POUR BTL (IV SOLUTION) ×2 IMPLANT
PACK LAP CHOLE LZT030E (CUSTOM PROCEDURE TRAY) ×2 IMPLANT
PAD ARMBOARD 7.5X6 YLW CONV (MISCELLANEOUS) ×2 IMPLANT
SET BASIN LINEN APH (SET/KITS/TRAYS/PACK) ×2 IMPLANT
SET TUBE IRRIG SUCTION NO TIP (IRRIGATION / IRRIGATOR) ×1 IMPLANT
SET TUBE SMOKE EVAC HIGH FLOW (TUBING) ×2 IMPLANT
SLEEVE ENDOPATH XCEL 5M (ENDOMECHANICALS) ×2 IMPLANT
SUT MNCRL AB 4-0 PS2 18 (SUTURE) ×4 IMPLANT
SUT VICRYL 0 UR6 27IN ABS (SUTURE) ×2 IMPLANT
SYR 20ML LL LF (SYRINGE) ×3 IMPLANT
SYS BAG RETRIEVAL 10MM (BASKET) ×1
SYSTEM BAG RETRIEVAL 10MM (BASKET) ×1 IMPLANT
TROCAR ENDO BLADELESS 11MM (ENDOMECHANICALS) ×2 IMPLANT
TROCAR XCEL NON-BLD 5MMX100MML (ENDOMECHANICALS) ×2 IMPLANT
TROCAR XCEL UNIV SLVE 11M 100M (ENDOMECHANICALS) ×2 IMPLANT
TUBE CONNECTING 12X1/4 (SUCTIONS) ×2 IMPLANT
WARMER LAPAROSCOPE (MISCELLANEOUS) ×2 IMPLANT

## 2021-12-31 NOTE — Anesthesia Postprocedure Evaluation (Signed)
Anesthesia Post Note  Patient: David Parks  Procedure(s) Performed: LAPAROSCOPIC CHOLECYSTECTOMY (Abdomen)  Patient location during evaluation: PACU Anesthesia Type: General Level of consciousness: awake and alert Pain management: pain level controlled Vital Signs Assessment: post-procedure vital signs reviewed and stable Respiratory status: spontaneous breathing, nonlabored ventilation, respiratory function stable and patient connected to nasal cannula oxygen Cardiovascular status: blood pressure returned to baseline and stable Postop Assessment: no apparent nausea or vomiting Anesthetic complications: no   No notable events documented.   Last Vitals:  Vitals:   12/31/21 0845 12/31/21 0857  BP: 124/67 135/73  Pulse: 76 73  Resp: 17 16  Temp:  36.9 C  SpO2: 93% 93%    Last Pain:  Vitals:   12/31/21 0857  TempSrc: Oral  PainSc: 3                  Glynis Smiles

## 2021-12-31 NOTE — Op Note (Signed)
Patient:  David Parks  DOB:  [birth year]  MRN:  [id number]   Preop Diagnosis: Cholelithiasis, history of choledocholithiasis  Postop Diagnosis: Same  Procedure: Laparoscopic cholecystectomy  Surgeon: Aviva Signs, MD  Anes: General endotracheal  Indications: Patient is a 66 year old white male status post ERCP with stone extraction in the past who now presents for laparoscopic cholecystectomy with possible cholangiograms.  The risks and benefits of the procedure including bleeding, infection, hepatobiliary injury, the possibility of an open procedure were fully explained to the patient, who gave informed consent.  Procedure note: The patient was placed in the supine position.  After induction of general endotracheal anesthesia, the abdomen was prepped and draped using the usual sterile technique with ChloraPrep.  Surgical site confirmation was performed.  A supraumbilical incision was made down to the fascia.  Veress needle was introduced into the abdominal cavity and confirmation of placement was done using the saline drop test.  The abdomen was then insufflated to 15 mmHg pressure.  An 11 mm trocar was introduced into the abdominal cavity under direct visualization without difficulty.  The patient was placed in reverse Trendelenburg position and an additional 11 mm trocar was placed in the epigastric region and 5 mm trochars were placed in the right upper quadrant and right flank regions.  The liver was noted to be within normal limits.  The gallbladder was noted to be mildly edematous with multiple adhesions surrounding it.  These adhesions were freed away without difficulty.  The gallbladder was then retracted in a dynamic fashion in order to provide a critical view of the triangle of Calot.  The cystic duct was first identified.  Its juncture to the infundibulum was fully identified.  The cystic duct was significantly narrowed, precluding a cholangiogram.  The cystic duct was ligated  and divided using clips.  The cystic artery was likewise ligated and divided.  The gallbladder was freed away from the gallbladder fossa using Bovie electrocautery.  The gallbladder was delivered through the epigastric trocar site using an Endo Catch bag.  The gallbladder fossa was inspected and no abnormal bleeding or bile leakage was noted.  Arista and Surgicel were placed in the gallbladder fossa.  All fluid and air were then evacuated from the abdominal cavity prior to removal of the trochars.  All wounds were irrigated with normal saline.  All wounds were injected with Exparel.  The epigastric fascia was reapproximated using an 0 Vicryl interrupted suture.  All skin incisions were closed using a 4-0 Monocryl subcuticular suture.  Dermabond was applied.  All tape and needle counts were correct at the end of the procedure.  The patient was extubated in the operating room and transferred to PACU in stable condition.  Complications: None  EBL: Minimal  Specimen: Gallbladder

## 2021-12-31 NOTE — Interval H&P Note (Signed)
History and Physical Interval Note:  12/31/2021 7:21 AM  David Parks  has presented today for surgery, with the diagnosis of Cholelithiasis.  The various methods of treatment have been discussed with the patient and family. After consideration of risks, benefits and other options for treatment, the patient has consented to  Procedure(s): LAPAROSCOPIC CHOLECYSTECTOMY WITH INTRAOPERATIVE CHOLANGIOGRAM (N/A) as a surgical intervention.  The patient's history has been reviewed, patient examined, no change in status, stable for surgery.  I have reviewed the patient's chart and labs.  Questions were answered to the patient's satisfaction.     Franky Macho

## 2021-12-31 NOTE — Anesthesia Procedure Notes (Signed)
Procedure Name: Intubation Date/Time: 12/31/2021 7:38 AM Performed by: Minerva Ends, CRNA Pre-anesthesia Checklist: Patient identified, Emergency Drugs available, Suction available and Patient being monitored Patient Re-evaluated:Patient Re-evaluated prior to induction Oxygen Delivery Method: Circle system utilized Preoxygenation: Pre-oxygenation with 100% oxygen Induction Type: IV induction Ventilation: Mask ventilation with difficulty Laryngoscope Size: Mac and 3 Grade View: Grade II Tube type: Oral Tube size: 7.0 mm Number of attempts: 1 Airway Equipment and Method: Stylet Placement Confirmation: ETT inserted through vocal cords under direct vision, positive ETCO2 and breath sounds checked- equal and bilateral Secured at: 23 cm Tube secured with: Tape Dental Injury: Teeth and Oropharynx as per pre-operative assessment

## 2021-12-31 NOTE — Transfer of Care (Signed)
Immediate Anesthesia Transfer of Care Note  Patient: David Parks  Procedure(s) Performed: LAPAROSCOPIC CHOLECYSTECTOMY (Abdomen)  Patient Location: PACU  Anesthesia Type:General  Level of Consciousness: awake, alert  and oriented  Airway & Oxygen Therapy: Patient Spontanous Breathing  Post-op Assessment: Report given to RN and Post -op Vital signs reviewed and stable  Post vital signs: Reviewed and stable  Last Vitals:  Vitals Value Taken Time  BP 155/72 12/31/21 0830  Temp    Pulse 80 12/31/21 0831  Resp 18 12/31/21 0831  SpO2 92 % 12/31/21 0831  Vitals shown include unvalidated device data.  Last Pain:  Vitals:   12/31/21 0637  TempSrc: Oral  PainSc: 2       Patients Stated Pain Goal: 9 (12/31/21 1749)  Complications: No notable events documented.

## 2021-12-31 NOTE — Anesthesia Preprocedure Evaluation (Signed)
Anesthesia Evaluation  Patient identified by MRN, date of birth, ID band Patient awake    Reviewed: Allergy & Precautions, NPO status , Patient's Chart, lab work & pertinent test results  History of Anesthesia Complications Negative for: history of anesthetic complications  Airway Mallampati: II  TM Distance: >3 FB Neck ROM: Full    Dental  (+) Dental Advisory Given, Missing, Chipped, Poor Dentition,    Pulmonary asthma , Current Smoker and Patient abstained from smoking.,    Pulmonary exam normal breath sounds clear to auscultation       Cardiovascular Exercise Tolerance: Good hypertension, Pt. on medications negative cardio ROS Normal cardiovascular exam Rhythm:Regular Rate:Normal  26-Jul-2021 13:49:08 [facility]300 ROUTINE RECORD 12-28-1955 (49 yr) Male Caucasian Room: Loc:903 Technician: Test ind: Vent. rate 80 BPM PR interval 188 ms QRS duration 186 ms QT/QTcB 452/521 ms P-R-T axes 60 -34 80 Normal sinus rhythm Left axis deviation Left bundle branch block Abnormal ECG   Neuro/Psych negative neurological ROS  negative psych ROS   GI/Hepatic negative GI ROS, Neg liver ROS, (+) B  Endo/Other  negative endocrine ROS  Renal/GU negative Renal ROS     Musculoskeletal negative musculoskeletal ROS (+)   Abdominal   Peds  Hematology negative hematology ROS (+)   Anesthesia Other Findings Near fainting spells   Reproductive/Obstetrics negative OB ROS                             Anesthesia Physical  Anesthesia Plan  ASA: 3  Anesthesia Plan: General   Post-op Pain Management:    Induction: Intravenous  PONV Risk Score and Plan: 2 and Ondansetron and Dexamethasone  Airway Management Planned: Oral ETT  Additional Equipment:   Intra-op Plan:   Post-operative Plan: Extubation in OR  Informed Consent: I have reviewed the patients History and Physical,  chart, labs and discussed the procedure including the risks, benefits and alternatives for the proposed anesthesia with the patient or authorized representative who has indicated his/her understanding and acceptance.     Dental advisory given  Plan Discussed with: CRNA and Surgeon  Anesthesia Plan Comments:         Anesthesia Quick Evaluation

## 2022-01-03 ENCOUNTER — Encounter (HOSPITAL_COMMUNITY): Payer: Self-pay | Admitting: General Surgery

## 2022-01-03 LAB — SURGICAL PATHOLOGY

## 2022-01-06 ENCOUNTER — Ambulatory Visit (INDEPENDENT_AMBULATORY_CARE_PROVIDER_SITE_OTHER): Payer: Medicare HMO | Admitting: General Surgery

## 2022-01-06 ENCOUNTER — Encounter: Payer: Self-pay | Admitting: General Surgery

## 2022-01-06 ENCOUNTER — Other Ambulatory Visit: Payer: Self-pay

## 2022-01-06 DIAGNOSIS — Z09 Encounter for follow-up examination after completed treatment for conditions other than malignant neoplasm: Secondary | ICD-10-CM

## 2022-01-06 NOTE — Progress Notes (Signed)
Virtual telephone postop visit performed.  States he is doing well.  His appetite is improving.  He denies any nausea or vomiting.  I told him to call me should any problems arise.  He will return to my office in 1 month to schedule his inguinal hernia repair.  As this was a part of the global surgical fee, this was not a billable visit.  Total telephone time was 3 minutes.

## 2022-02-07 ENCOUNTER — Encounter: Payer: Self-pay | Admitting: Internal Medicine

## 2022-02-09 ENCOUNTER — Encounter: Payer: Medicare HMO | Admitting: General Surgery

## 2022-02-16 ENCOUNTER — Encounter: Payer: Self-pay | Admitting: General Surgery

## 2022-02-16 ENCOUNTER — Other Ambulatory Visit: Payer: Self-pay

## 2022-02-16 ENCOUNTER — Ambulatory Visit (INDEPENDENT_AMBULATORY_CARE_PROVIDER_SITE_OTHER): Payer: Medicare HMO | Admitting: General Surgery

## 2022-02-16 VITALS — BP 122/77 | HR 94 | Temp 98.0°F | Resp 16 | Ht 74.0 in | Wt 204.0 lb

## 2022-02-16 DIAGNOSIS — K4090 Unilateral inguinal hernia, without obstruction or gangrene, not specified as recurrent: Secondary | ICD-10-CM

## 2022-02-16 DIAGNOSIS — Z09 Encounter for follow-up examination after completed treatment for conditions other than malignant neoplasm: Secondary | ICD-10-CM

## 2022-02-18 NOTE — Progress Notes (Signed)
David Parks; [id number]; Jun 26, 1956 ? ? ?HPI ?Patient is a 66 year old white male who presents back to my care for scheduling of a left inguinal hernia repair.  He underwent an uneventful laparoscopic cholecystectomy in January of this year.  He states his left inguinal hernia does worsen with straining or standing.  It is reducible.  It was difficult for them to do a colonoscopy due to his left inguinal hernia.  He denies any nausea or vomiting. ?Past Medical History:  ?Diagnosis Date  ? Asthma   ? as a child  ? Complication of anesthesia   ? difficulty breathing when waking up  ? History of hepatitis B   ? HTN (hypertension)   ? Hyperlipidemia   ? Insomnia   ? PUD (peptic ulcer disease)   ? Per patient, EGD in the 90s at [facility] with ulcer  ? ? ?Past Surgical History:  ?Procedure Laterality Date  ? BACK SURGERY    ? x2  ? CHOLECYSTECTOMY N/A 12/31/2021  ? Procedure: LAPAROSCOPIC CHOLECYSTECTOMY;  Surgeon: Aviva Signs, MD;  Location: [facility];  Service: General;  Laterality: N/A;  ? ERCP N/A 07/28/2021  ? Procedure: ENDOSCOPIC RETROGRADE CHOLANGIOPANCREATOGRAPHY (ERCP);  Surgeon: Rogene Houston, MD;  Location: [facility];  Service: Endoscopy;  Laterality: N/A;  ? ESOPHAGOGASTRODUODENOSCOPY    ? Per patient, EGD in the 90s at [facility] with ulcer.  ? FLEXIBLE SIGMOIDOSCOPY  05/17/2021  ? carver:Non-bleeding internal hemorrhoids. diverticulosis in sigmoid and descending colon, stricture in descending colon, no specimens collected. will need repeat colonoscopy after inguinal hernia repair  ? REMOVAL OF STONES N/A 07/28/2021  ? Procedure: REMOVAL OF STONES;  Surgeon: Rogene Houston, MD;  Location: [facility];  Service: Endoscopy;  Laterality: N/A;  ? SPHINCTEROTOMY N/A 07/28/2021  ? Procedure: SPHINCTEROTOMY;  Surgeon: Rogene Houston, MD;  Location: [facility];  Service: Endoscopy;  Laterality: N/A;  ? ? ?Family History  ?Problem Relation Age of Onset  ? Heart failure Mother   ? Heart failure Brother   ? Colon cancer Neg Hx    ? ? ?Current Outpatient Medications on File Prior to Visit  ?Medication Sig Dispense Refill  ? alum & mag hydroxide-simeth (MAALOX/MYLANTA) 200-200-20 MG/5ML suspension Take 15 mLs by mouth every 6 (six) hours as needed for indigestion or heartburn.    ? aspirin EC 81 MG tablet Take 1 tablet (81 mg total) by mouth in the morning. Swallow whole. 30 tablet 11  ? Cholecalciferol (VITAMIN D3) 50 MCG (2000 UT) TABS Take 2,000 Units by mouth in the morning.    ? hydrochlorothiazide (HYDRODIURIL) 25 MG tablet Take 25 mg by mouth in the morning.    ? lovastatin (MEVACOR) 40 MG tablet Take 40 mg by mouth every evening.    ? Melatonin 10 MG TABS Take 10 mg by mouth at bedtime.    ? Omega-3 Fatty Acids (FISH OIL) 1000 MG CAPS Take 1,000 mg by mouth in the morning and at bedtime.    ? traZODone (DESYREL) 50 MG tablet Take 50 mg by mouth at bedtime.    ? zolpidem (AMBIEN) 5 MG tablet Take 5 mg by mouth at bedtime as needed for sleep.    ? ?No current facility-administered medications on file prior to visit.  ? ? ?Allergies  ?Allergen Reactions  ? Codeine Other (See Comments)  ?  Swelling of the tongue and can not swallow   ? Tetracyclines & Related Other (See Comments)  ?  Swelling of the tongue and can not  swallow   ? ? ?Social History  ? ?Substance and Sexual Activity  ?Alcohol Use Yes  ? Comment: rarely. maybe at [place]  ? ? ?Social History  ? ?Tobacco Use  ?Smoking Status Every Day  ? Packs/day: 1.00  ? Years: 60.00  ? Pack years: 60.00  ? Types: Cigarettes  ?Smokeless Tobacco Never  ? ? ?Review of Systems  ?Constitutional: Negative.   ?HENT: Negative.    ?Eyes: Negative.   ?Respiratory: Negative.    ?Cardiovascular: Negative.   ?Gastrointestinal: Negative.   ?Genitourinary: Negative.   ?Musculoskeletal: Negative.   ?Skin: Negative.   ?Neurological: Negative.   ?Endo/Heme/Allergies: Negative.   ?Psychiatric/Behavioral: Negative.    ? ?Objective  ? ?Vitals:  ? 02/16/22 1403  ?BP: 122/77  ?Pulse: 94  ?Resp: 16  ?Temp: 98  ?F (36.7 ?C)  ?SpO2: 96%  ? ? ?Physical Exam ?Vitals reviewed.  ?Constitutional:   ?   Appearance: Normal appearance. He is normal weight. He is not ill-appearing.  ?HENT:  ?   Head: Normocephalic and atraumatic.  ?Cardiovascular:  ?   Rate and Rhythm: Normal rate and regular rhythm.  ?   Heart sounds: Normal heart sounds. No murmur heard. ?  No friction rub. No gallop.  ?Pulmonary:  ?   Effort: Pulmonary effort is normal. No respiratory distress.  ?   Breath sounds: Normal breath sounds. No stridor. No wheezing, rhonchi or rales.  ?Abdominal:  ?   General: Bowel sounds are normal. There is no distension.  ?   Palpations: Abdomen is soft. There is no mass.  ?   Tenderness: There is no abdominal tenderness. There is no guarding or rebound.  ?   Hernia: A hernia is present.  ?   Comments: Reducible left inguinal hernia  ?Genitourinary: ?   Testes: Normal.  ?Skin: ?   General: Skin is warm and dry.  ?Neurological:  ?   Mental Status: He is alert and oriented to person, place, and time.  ? ? ?Assessment  ?Left inguinal hernia ?Plan  ?Patient is scheduled for left inguinal herniorrhaphy with mesh on 02/28/2022.  The risks and benefits of the procedure including bleeding, infection, mesh use, and the possibility of recurrence of the hernia were fully explained to the patient, who gave informed consent. ?

## 2022-02-18 NOTE — H&P (Signed)
David Parks; [id number]; 06/15/1956 ? ? ?HPI ?Patient is a 65-year-old white male who presents back to my care for scheduling of a left inguinal hernia repair.  He underwent an uneventful laparoscopic cholecystectomy in January of this year.  He states his left inguinal hernia does worsen with straining or standing.  It is reducible.  It was difficult for them to do a colonoscopy due to his left inguinal hernia.  He denies any nausea or vomiting. ?Past Medical History:  ?Diagnosis Date  ? Asthma   ? as a child  ? Complication of anesthesia   ? difficulty breathing when waking up  ? History of hepatitis B   ? HTN (hypertension)   ? Hyperlipidemia   ? Insomnia   ? PUD (peptic ulcer disease)   ? Per patient, EGD in the 90s at [facility] with ulcer  ? ? ?Past Surgical History:  ?Procedure Laterality Date  ? BACK SURGERY    ? x2  ? CHOLECYSTECTOMY N/A 12/31/2021  ? Procedure: LAPAROSCOPIC CHOLECYSTECTOMY;  Surgeon: Jamison Soward, MD;  Location: [facility];  Service: General;  Laterality: N/A;  ? ERCP N/A 07/28/2021  ? Procedure: ENDOSCOPIC RETROGRADE CHOLANGIOPANCREATOGRAPHY (ERCP);  Surgeon: Rehman, Najeeb U, MD;  Location: [facility];  Service: Endoscopy;  Laterality: N/A;  ? ESOPHAGOGASTRODUODENOSCOPY    ? Per patient, EGD in the 90s at [facility] with ulcer.  ? FLEXIBLE SIGMOIDOSCOPY  05/17/2021  ? carver:Non-bleeding internal hemorrhoids. diverticulosis in sigmoid and descending colon, stricture in descending colon, no specimens collected. will need repeat colonoscopy after inguinal hernia repair  ? REMOVAL OF STONES N/A 07/28/2021  ? Procedure: REMOVAL OF STONES;  Surgeon: Rehman, Najeeb U, MD;  Location: [facility];  Service: Endoscopy;  Laterality: N/A;  ? SPHINCTEROTOMY N/A 07/28/2021  ? Procedure: SPHINCTEROTOMY;  Surgeon: Rehman, Najeeb U, MD;  Location: [facility];  Service: Endoscopy;  Laterality: N/A;  ? ? ?Family History  ?Problem Relation Age of Onset  ? Heart failure Mother   ? Heart failure Brother   ? Colon cancer Neg Hx    ? ? ?Current Outpatient Medications on File Prior to Visit  ?Medication Sig Dispense Refill  ? alum & mag hydroxide-simeth (MAALOX/MYLANTA) 200-200-20 MG/5ML suspension Take 15 mLs by mouth every 6 (six) hours as needed for indigestion or heartburn.    ? aspirin EC 81 MG tablet Take 1 tablet (81 mg total) by mouth in the morning. Swallow whole. 30 tablet 11  ? Cholecalciferol (VITAMIN D3) 50 MCG (2000 UT) TABS Take 2,000 Units by mouth in the morning.    ? hydrochlorothiazide (HYDRODIURIL) 25 MG tablet Take 25 mg by mouth in the morning.    ? lovastatin (MEVACOR) 40 MG tablet Take 40 mg by mouth every evening.    ? Melatonin 10 MG TABS Take 10 mg by mouth at bedtime.    ? Omega-3 Fatty Acids (FISH OIL) 1000 MG CAPS Take 1,000 mg by mouth in the morning and at bedtime.    ? traZODone (DESYREL) 50 MG tablet Take 50 mg by mouth at bedtime.    ? zolpidem (AMBIEN) 5 MG tablet Take 5 mg by mouth at bedtime as needed for sleep.    ? ?No current facility-administered medications on file prior to visit.  ? ? ?Allergies  ?Allergen Reactions  ? Codeine Other (See Comments)  ?  Swelling of the tongue and can not swallow   ? Tetracyclines & Related Other (See Comments)  ?  Swelling of the tongue and can not   swallow   ? ? ?Social History  ? ?Substance and Sexual Activity  ?Alcohol Use Yes  ? Comment: rarely. maybe at [place]  ? ? ?Social History  ? ?Tobacco Use  ?Smoking Status Every Day  ? Packs/day: 1.00  ? Years: 60.00  ? Pack years: 60.00  ? Types: Cigarettes  ?Smokeless Tobacco Never  ? ? ?Review of Systems  ?Constitutional: Negative.   ?HENT: Negative.    ?Eyes: Negative.   ?Respiratory: Negative.    ?Cardiovascular: Negative.   ?Gastrointestinal: Negative.   ?Genitourinary: Negative.   ?Musculoskeletal: Negative.   ?Skin: Negative.   ?Neurological: Negative.   ?Endo/Heme/Allergies: Negative.   ?Psychiatric/Behavioral: Negative.    ? ?Objective  ? ?Vitals:  ? 02/16/22 1403  ?BP: 122/77  ?Pulse: 94  ?Resp: 16  ?Temp: 98  ?F (36.7 ?C)  ?SpO2: 96%  ? ? ?Physical Exam ?Vitals reviewed.  ?Constitutional:   ?   Appearance: Normal appearance. He is normal weight. He is not ill-appearing.  ?HENT:  ?   Head: Normocephalic and atraumatic.  ?Cardiovascular:  ?   Rate and Rhythm: Normal rate and regular rhythm.  ?   Heart sounds: Normal heart sounds. No murmur heard. ?  No friction rub. No gallop.  ?Pulmonary:  ?   Effort: Pulmonary effort is normal. No respiratory distress.  ?   Breath sounds: Normal breath sounds. No stridor. No wheezing, rhonchi or rales.  ?Abdominal:  ?   General: Bowel sounds are normal. There is no distension.  ?   Palpations: Abdomen is soft. There is no mass.  ?   Tenderness: There is no abdominal tenderness. There is no guarding or rebound.  ?   Hernia: A hernia is present.  ?   Comments: Reducible left inguinal hernia  ?Genitourinary: ?   Testes: Normal.  ?Skin: ?   General: Skin is warm and dry.  ?Neurological:  ?   Mental Status: He is alert and oriented to person, place, and time.  ? ? ?Assessment  ?Left inguinal hernia ?Plan  ?Patient is scheduled for left inguinal herniorrhaphy with mesh on 02/28/2022.  The risks and benefits of the procedure including bleeding, infection, mesh use, and the possibility of recurrence of the hernia were fully explained to the patient, who gave informed consent. ?

## 2022-02-25 ENCOUNTER — Encounter (HOSPITAL_COMMUNITY): Payer: Medicare HMO

## 2022-02-28 ENCOUNTER — Encounter (HOSPITAL_COMMUNITY): Admission: RE | Payer: Self-pay | Source: Home / Self Care

## 2022-02-28 ENCOUNTER — Ambulatory Visit (HOSPITAL_COMMUNITY): Admission: RE | Admit: 2022-02-28 | Payer: Medicare HMO | Source: Home / Self Care | Admitting: General Surgery

## 2022-02-28 IMAGING — XA DG ERCP WO/W SPHINCTEROTOMY
1 series · 14 of 24 positions shown · non-contrast
Comparison: None.

CLINICAL DATA: ERCP

EXAM:
ERCP
TECHNIQUE: Multiple spot images obtained with the fluoroscopic device and
submitted for interpretation post-procedure.
FLUOROSCOPY TIME:  Fluoroscopy Time:  1 minutes 28 seconds
Radiation Exposure Index (if provided by the fluoroscopic device):
Not provided
Number of Acquired Spot Images: 7 fluoroscopic cine loops are
submitted for review

[Series 1: dg or · 0.20mm/px · 10 acquisitions, 14 frames shown]
[im 1/10]
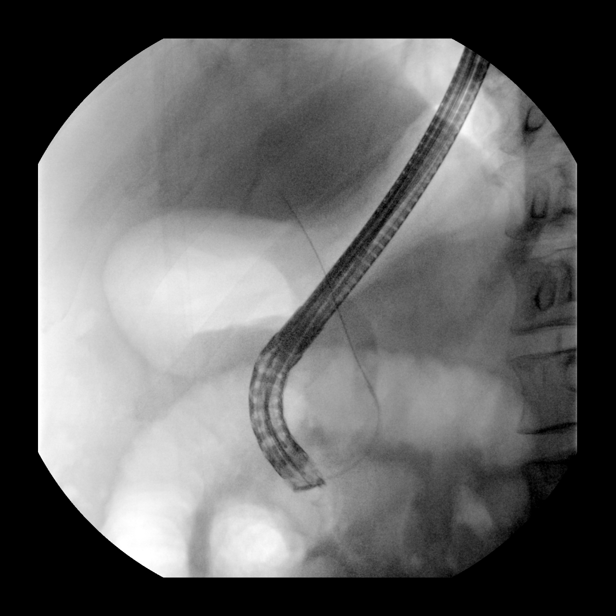
[im 1/10]
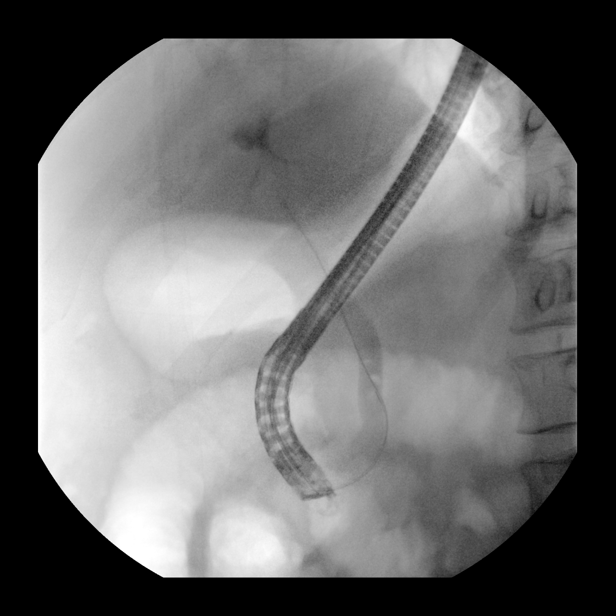
[im 2/10]
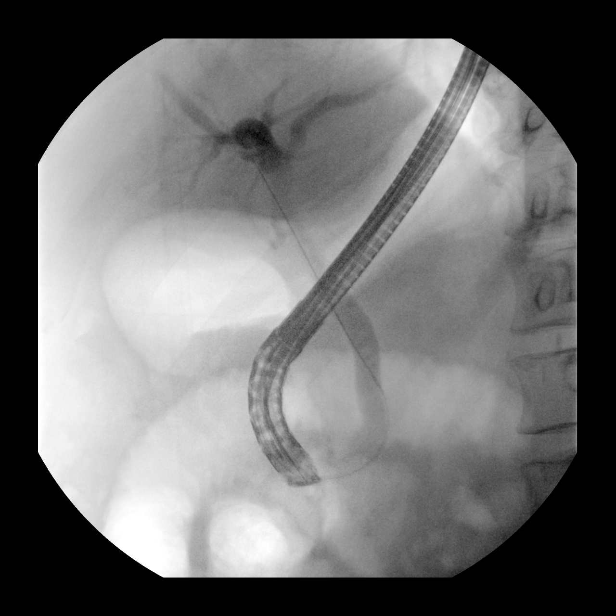
[im 2/10]
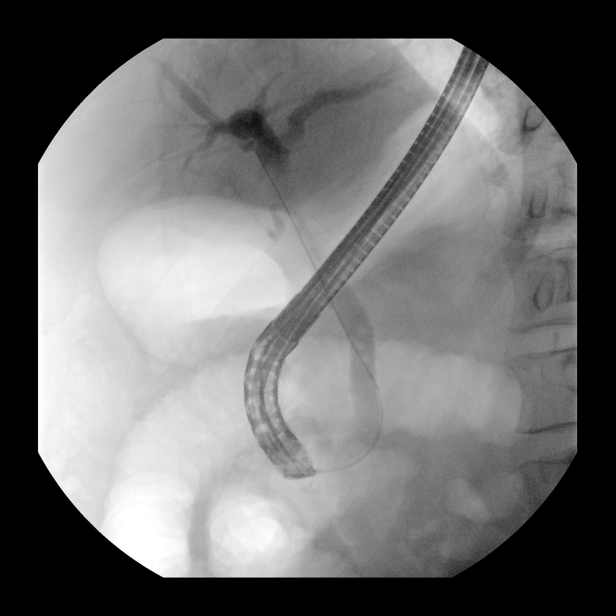
[im 3/10]
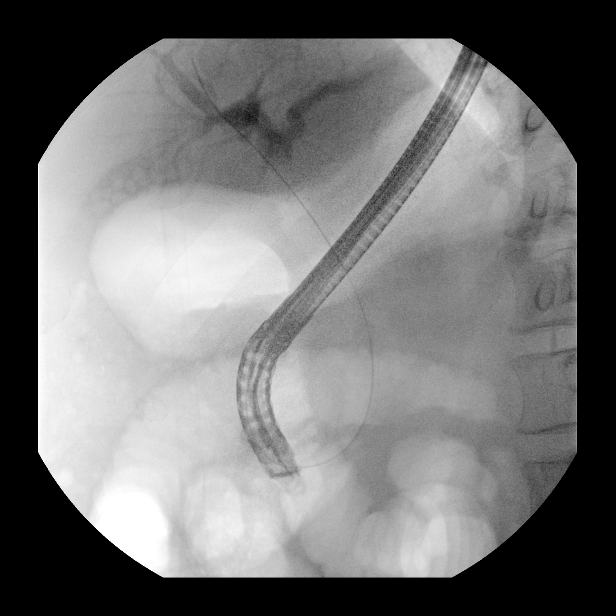
[im 5/10]
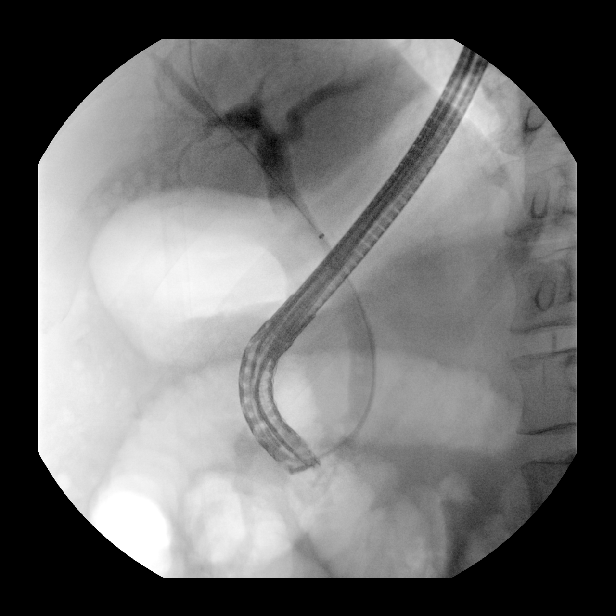
[im 5/10]
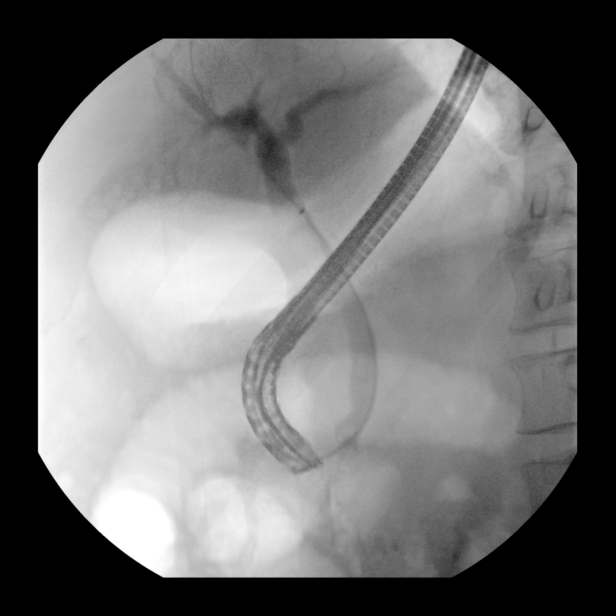
[im 6/10]
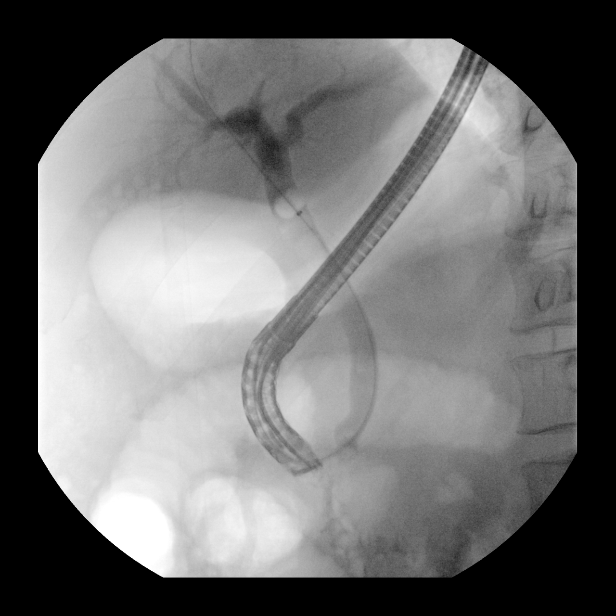
[im 7/10]
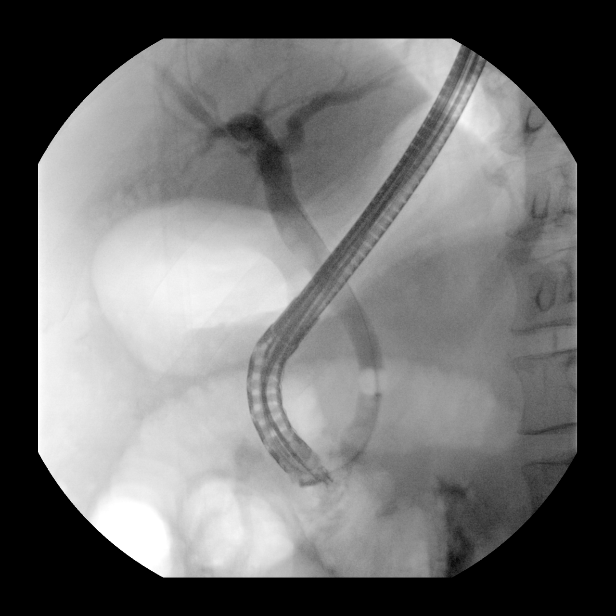
[im 8/10]
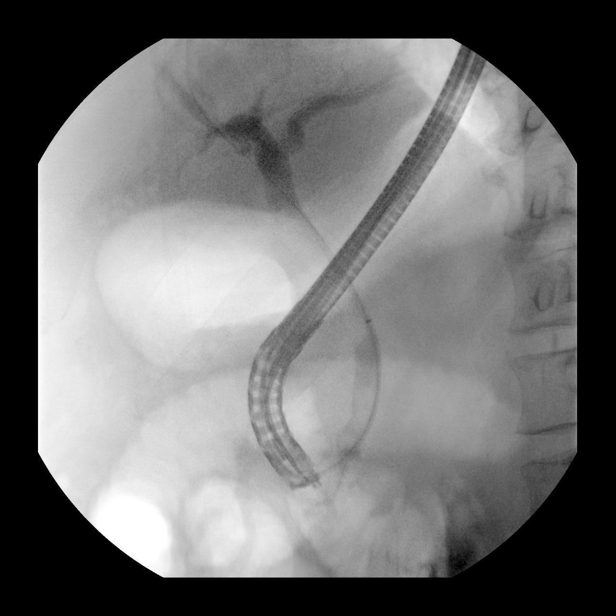
[im 8/10]
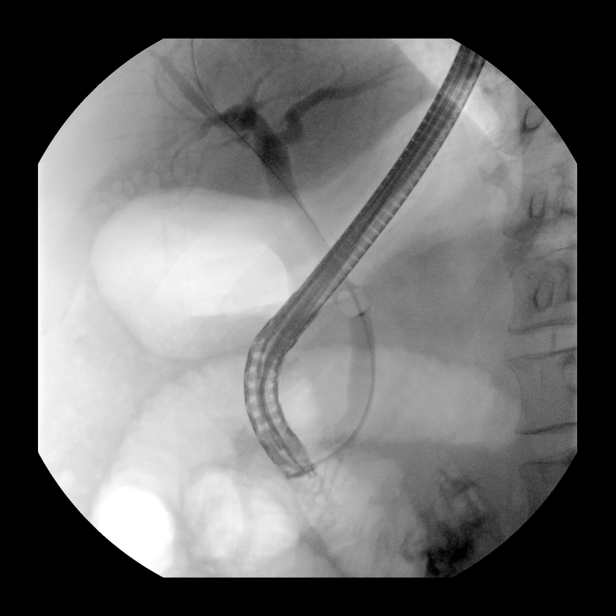
[im 8/10]
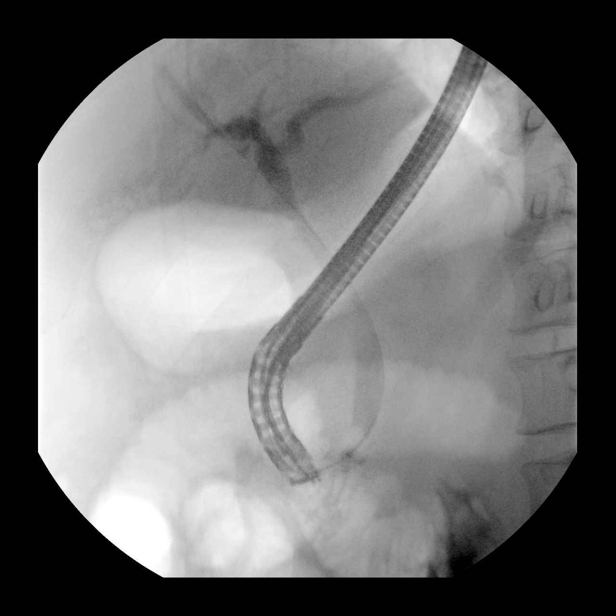
[im 9/10]
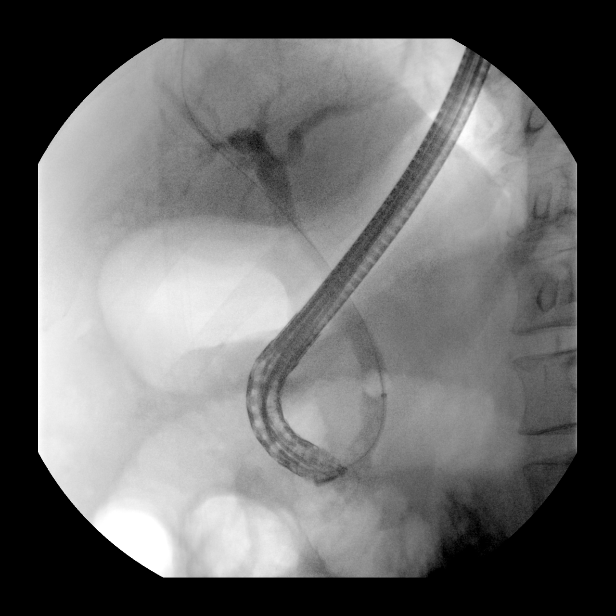
[im 10/10]
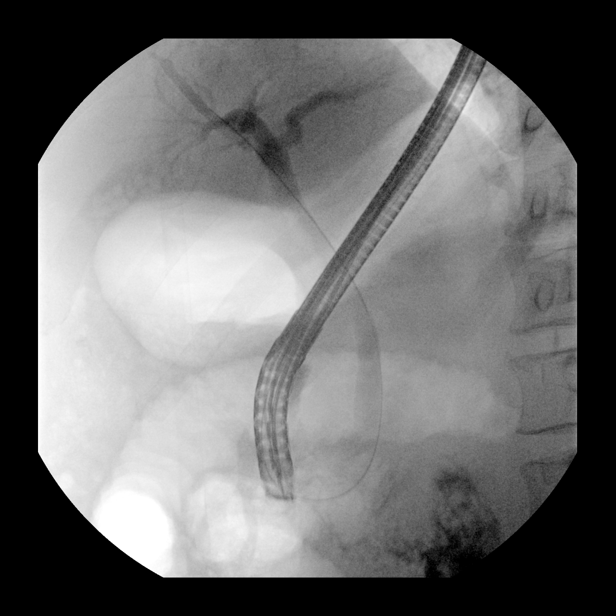

[14 of 24 positions shown; findings below may reference images not displayed]

FINDINGS: Seven fluoroscopic cine loops acquired during intra procedural ERCP
are submitted for review. Initial images show show a scope with
catheterization of the common bile duct. Cholangiogram of the
intrahepatic and extrahepatic bile ducts are performed. This
demonstrates multiple small filling defects within the distal CBD.
Opacification of the gallbladder with multiple gallstones is
revealed. Subsequently, multiple balloon sweeps were performed of
the CBD. Final balloon sweep appears to demonstrate no residual
gallstones in the CBD.
IMPRESSION: Intraprocedural ERCP with balloon sweep of the CBD as described.
Refer to operative report for full details.

These images were submitted for radiologic interpretation only.
Please see the procedural report for the amount of contrast and the
fluoroscopy time utilized.

## 2022-02-28 SURGERY — REPAIR, HERNIA, INGUINAL, ADULT
Anesthesia: General | Laterality: Left

## 2022-04-07 ENCOUNTER — Ambulatory Visit: Payer: Medicare HMO | Admitting: Internal Medicine

## 2022-04-28 ENCOUNTER — Encounter: Payer: Self-pay | Admitting: Internal Medicine

## 2022-04-28 ENCOUNTER — Encounter: Payer: Self-pay | Admitting: *Deleted

## 2022-04-28 ENCOUNTER — Ambulatory Visit (INDEPENDENT_AMBULATORY_CARE_PROVIDER_SITE_OTHER): Payer: Medicare HMO | Admitting: Internal Medicine

## 2022-04-28 ENCOUNTER — Telehealth: Payer: Self-pay | Admitting: *Deleted

## 2022-04-28 VITALS — BP 120/62 | HR 76 | Temp 97.3°F | Ht 74.0 in | Wt 203.6 lb

## 2022-04-28 DIAGNOSIS — R195 Other fecal abnormalities: Secondary | ICD-10-CM

## 2022-04-28 DIAGNOSIS — K4090 Unilateral inguinal hernia, without obstruction or gangrene, not specified as recurrent: Secondary | ICD-10-CM

## 2022-04-28 MED ORDER — PEG 3350-KCL-NA BICARB-NACL 420 G PO SOLR: ORAL | 0 refills | Status: AC

## 2022-04-28 NOTE — Addendum Note (Signed)
Addended by: Armstead Peaks on: 04/28/2022 02:39 PM   Modules accepted: Orders

## 2022-04-28 NOTE — Patient Instructions (Signed)
We will schedule you for colonoscopy to further evaluate your positive Cologuard testing.  We will likely perform this on your back with your inguinal hernia reduced.  It was nice seeing you again today.  Dr. Marletta Lor

## 2022-04-28 NOTE — Telephone Encounter (Signed)
PA approved via cohere. Auth# [id number], DOS: 06/07/2022 - 09/05/2022

## 2022-04-28 NOTE — Progress Notes (Signed)
Referring Provider: Pearson Grippe, MD Primary Care Physician:  Pearson Grippe, MD Primary GI:  Dr. Marletta Lor  Chief Complaint  Patient presents with   Follow-up    Consult on colonoscopy options    HPI:   David Parks is a 66 y.o. male who presents to the clinic today for follow-up visit.  Initially seen previously for positive Cologuard testing.  Colonoscopy attempted but aborted due to tight sigmoid turn/stricture.    He subsequently underwent CT colonography which was a nondiagnostic study due to decompression of the colon proximal to sigmoid colon which was contained in an inguinal hernia.  Did make mention of cholelithiasis as well as choledocholithiasis.  He subsequently underwent ERCP 07/28/2021 with multiple stones removed.  Underwent cholecystectomy January 2023.  Tolerated procedure well.  Was scheduled to have inguinal hernia repair in March 2023 though he canceled this as he states he has had too many surgeries as of late.  Today he states he is doing well.  No complaints.  Wishes to schedule colonoscopy.  Past Medical History:  Diagnosis Date   Asthma    as a child   Complication of anesthesia    difficulty breathing when waking up   History of hepatitis B    HTN (hypertension)    Hyperlipidemia    Insomnia    PUD (peptic ulcer disease)    Per patient, EGD in the 90s at [facility] with ulcer    Past Surgical History:  Procedure Laterality Date   BACK SURGERY     x2   CHOLECYSTECTOMY N/A 12/31/2021   Procedure: LAPAROSCOPIC CHOLECYSTECTOMY;  Surgeon: Franky Macho, MD;  Location: [facility];  Service: General;  Laterality: N/A;   ERCP N/A 07/28/2021   Procedure: ENDOSCOPIC RETROGRADE CHOLANGIOPANCREATOGRAPHY (ERCP);  Surgeon: Malissa Hippo, MD;  Location: [facility];  Service: Endoscopy;  Laterality: N/A;   ESOPHAGOGASTRODUODENOSCOPY     Per patient, EGD in the 90s at [facility] with ulcer.   FLEXIBLE SIGMOIDOSCOPY  05/17/2021   Caleb Decock:Non-bleeding internal hemorrhoids.  diverticulosis in sigmoid and descending colon, stricture in descending colon, no specimens collected. will need repeat colonoscopy after inguinal hernia repair   REMOVAL OF STONES N/A 07/28/2021   Procedure: REMOVAL OF STONES;  Surgeon: Malissa Hippo, MD;  Location: [facility];  Service: Endoscopy;  Laterality: N/A;   SPHINCTEROTOMY N/A 07/28/2021   Procedure: SPHINCTEROTOMY;  Surgeon: Malissa Hippo, MD;  Location: [facility];  Service: Endoscopy;  Laterality: N/A;    Current Outpatient Medications  Medication Sig Dispense Refill   aspirin EC 81 MG tablet Take 1 tablet (81 mg total) by mouth in the morning. Swallow whole. 30 tablet 11   Cholecalciferol (VITAMIN D3) 50 MCG (2000 UT) TABS Take 2,000 Units by mouth in the morning.     hydrochlorothiazide (HYDRODIURIL) 25 MG tablet Take 25 mg by mouth in the morning.     lovastatin (MEVACOR) 40 MG tablet Take 40 mg by mouth every evening.     Melatonin 10 MG TABS Take 10 mg by mouth at bedtime.     Omega-3 Fatty Acids (FISH OIL) 1000 MG CAPS Take 1,000 mg by mouth in the morning and at bedtime.     traZODone (DESYREL) 50 MG tablet Take 50 mg by mouth at bedtime.     zolpidem (AMBIEN) 5 MG tablet Take 5 mg by mouth at bedtime as needed for sleep.     alum & mag hydroxide-simeth (MAALOX/MYLANTA) 200-200-20 MG/5ML suspension Take 15 mLs by mouth every  6 (six) hours as needed for indigestion or heartburn. (Patient not taking: Reported on 04/28/2022)     No current facility-administered medications for this visit.    Allergies as of 04/28/2022 - Review Complete 04/28/2022  Allergen Reaction Noted   Codeine Other (See Comments) 05/08/2014   Tetracyclines & related Other (See Comments) 05/08/2014    Family History  Problem Relation Age of Onset   Heart failure Mother    Heart failure Brother    Colon cancer Neg Hx     Social History   Socioeconomic History   Marital status: Widowed    Spouse name: Not on file   Number of children: Not on  file   Years of education: Not on file   Highest education level: Not on file  Occupational History   Not on file  Tobacco Use   Smoking status: Every Day    Packs/day: 1.00    Years: 60.00    Pack years: 60.00    Types: Cigarettes   Smokeless tobacco: Never  Vaping Use   Vaping Use: Never used  Substance and Sexual Activity   Alcohol use: Yes    Comment: rarely. maybe [place]   Drug use: Never   Sexual activity: Yes  Other Topics Concern   Not on file  Social History Narrative   Not on file   Social Determinants of Health   Financial Resource Strain: Not on file  Food Insecurity: Not on file  Transportation Needs: Not on file  Physical Activity: Not on file  Stress: Not on file  Social Connections: Not on file    Subjective: Review of Systems  Constitutional:  Negative for chills and fever.  HENT:  Negative for congestion and hearing loss.   Eyes:  Negative for blurred vision and double vision.  Respiratory:  Negative for cough and shortness of breath.   Cardiovascular:  Negative for chest pain and palpitations.  Gastrointestinal:  Negative for abdominal pain, blood in stool, constipation, diarrhea, heartburn, melena and vomiting.  Genitourinary:  Negative for dysuria and urgency.  Musculoskeletal:  Negative for joint pain and myalgias.  Skin:  Negative for itching and rash.  Neurological:  Negative for dizziness and headaches.  Psychiatric/Behavioral:  Negative for depression. The patient is not nervous/anxious.     Objective: BP 120/62   Pulse 76   Temp (!) 97.3 F (36.3 C)   Ht 6\' 2"  (1.88 m)   Wt 203 lb 9.6 oz (92.4 kg)   BMI 26.14 kg/m  Physical Exam Constitutional:      Appearance: Normal appearance.  HENT:     Head: Normocephalic and atraumatic.  Eyes:     Extraocular Movements: Extraocular movements intact.     Conjunctiva/sclera: Conjunctivae normal.  Cardiovascular:     Rate and Rhythm: Normal rate and regular rhythm.  Pulmonary:      Effort: Pulmonary effort is normal.     Breath sounds: Normal breath sounds.  Abdominal:     General: Bowel sounds are normal.     Palpations: Abdomen is soft.  Musculoskeletal:        General: Normal range of motion.     Cervical back: Normal range of motion and neck supple.  Skin:    General: Skin is warm.  Neurological:     General: No focal deficit present.     Mental Status: He is alert and oriented to person, place, and time.  Psychiatric:        Mood and Affect: Mood  normal.        Behavior: Behavior normal.     Assessment: *Positive Cologuard testing *Inguinal hernia  Plan: Given his positive Cologuard testing he still needs a repeat colonoscopy as previous was aborted due to tight sigmoid turn/stricture.  He has an inguinal hernia which could be contributing to his difficult colonoscopy.  He does not wish to undergo any further surgery today and would like to hold off on inguinal hernia repair.  He would like to reattempt colonoscopy.  Discussed that it may be difficult given his hernia.  He states he is able to reduce this with ease as long as he is on his back.  We will have him lay on his back prior to procedure with reduction of his hernia and then proceed with colonoscopy.  [facility] and he would like to proceed locally.   04/28/2022 2:21 PM   Disclaimer: This note was dictated with voice recognition software. Similar sounding words can inadvertently be transcribed and may not be corrected upon review.

## 2022-04-28 NOTE — Addendum Note (Signed)
Addended by: Cheron Every on: 04/28/2022 02:27 PM   Modules accepted: Orders

## 2022-05-26 ENCOUNTER — Telehealth: Payer: Self-pay | Admitting: *Deleted

## 2022-05-26 NOTE — Telephone Encounter (Signed)
Pt called in and reschedule procedure on for 6/27 with Dr. Marletta Lor. He has moved to 7/18 at 1pm. Aware will mail new instructions

## 2022-06-20 ENCOUNTER — Telehealth: Payer: Self-pay | Admitting: *Deleted

## 2022-06-20 NOTE — Telephone Encounter (Signed)
Received call from pt needing to r/s procedure on for 7/18 with Dr. Marletta Lor. He has been rescheduled to 8/29 at 9am. Aware will mail new instructions.  PA approved via cohere. Auth# [id number], DOS: 06/07/2022 - 09/05/2022

## 2022-08-01 ENCOUNTER — Telehealth: Payer: Self-pay | Admitting: *Deleted

## 2022-08-01 NOTE — Telephone Encounter (Signed)
Received call from pt and he needed to reschedule procedure scheduled for next week. Moved to 9/12 at 1:45pm. Aware will send new instructions.

## 2022-08-17 ENCOUNTER — Encounter: Payer: Self-pay | Admitting: *Deleted

## 2022-08-17 ENCOUNTER — Telehealth: Payer: Self-pay | Admitting: *Deleted

## 2022-08-17 NOTE — Telephone Encounter (Signed)
Patient called to reschedule his colonoscopy from 08/23/22 until 10/04/22 at 12:30 pm. New instructions will be mailed to pt.

## 2022-09-29 ENCOUNTER — Telehealth: Payer: Self-pay | Admitting: *Deleted

## 2022-09-29 ENCOUNTER — Other Ambulatory Visit: Payer: Self-pay | Admitting: *Deleted

## 2022-09-29 DIAGNOSIS — R195 Other fecal abnormalities: Secondary | ICD-10-CM

## 2022-09-29 NOTE — Telephone Encounter (Signed)
Received call from pt stating he went to the lab for his blood work and he was told they didn't have an order. I advised it was originally ordered back in May. I have placed a new order. He is going to go to [facility] tomorrow. Orders placed

## 2022-10-01 LAB — BASIC METABOLIC PANEL
BUN/Creatinine Ratio: 9 — ABNORMAL LOW (ref 10–24)
BUN: 8 mg/dL (ref 8–27)
CO2: 25 mmol/L (ref 20–29)
Calcium: 9.1 mg/dL (ref 8.6–10.2)
Chloride: 97 mmol/L (ref 96–106)
Creatinine, Ser: 0.9 mg/dL (ref 0.76–1.27)
Glucose: 89 mg/dL (ref 70–99)
Potassium: 4.2 mmol/L (ref 3.5–5.2)
Sodium: 136 mmol/L (ref 134–144)
eGFR: 94 mL/min/{1.73_m2} (ref >59)

## 2022-10-03 ENCOUNTER — Telehealth: Payer: Self-pay | Admitting: *Deleted

## 2022-10-03 NOTE — Telephone Encounter (Signed)
Pt will need OV to get rescheduled once he calls back

## 2022-10-03 NOTE — Telephone Encounter (Signed)
Pt called and cancelled his colonoscopy tomorrow 12/04/22 at 12:30 pm with Dr.Carver. He says he will call back to reschedule.

## 2022-10-04 ENCOUNTER — Ambulatory Visit (HOSPITAL_COMMUNITY): Admission: RE | Admit: 2022-10-04 | Payer: Medicare HMO | Source: Ambulatory Visit

## 2022-10-04 ENCOUNTER — Encounter (HOSPITAL_COMMUNITY): Admission: RE | Payer: Self-pay | Source: Ambulatory Visit

## 2022-10-04 SURGERY — COLONOSCOPY WITH PROPOFOL
Anesthesia: Monitor Anesthesia Care

## 2023-01-31 ENCOUNTER — Ambulatory Visit: Payer: Medicare HMO | Admitting: Pharmacist

## 2023-02-07 ENCOUNTER — Ambulatory Visit: Payer: Medicare HMO | Admitting: Pharmacist

## 2023-02-09 ENCOUNTER — Ambulatory Visit: Payer: Medicare HMO | Admitting: Pharmacist

## 2023-02-23 ENCOUNTER — Ambulatory Visit: Payer: Medicare HMO | Admitting: Pharmacist

## 2023-03-22 ENCOUNTER — Other Ambulatory Visit (HOSPITAL_COMMUNITY): Payer: Self-pay

## 2023-03-22 ENCOUNTER — Telehealth: Payer: Self-pay

## 2023-03-22 NOTE — Telephone Encounter (Signed)
[facility] Patient Product/process development scientist completed.    The patient is insured through [employer].  Insurance pays for [employer], [employer] . Medication will need a PA .  We will continue to follow to see if copay assistance is needed.  Clearance Coots, CPhT Specialty Pharmacy Patient [facility] Phone: [phone] Fax:  [phone]

## 2023-03-22 NOTE — Telephone Encounter (Signed)
He is coming for new PrEP visit - let me know about Truvada, Descovy, and Apretude. Thanks!

## 2023-03-24 ENCOUNTER — Ambulatory Visit: Payer: Medicare HMO | Admitting: Pharmacist

## 2023-04-19 NOTE — Progress Notes (Unsigned)
Date:  04/19/2023   HPI: David Parks is a 67 y.o. male who presents to the [facility] pharmacy clinic to discuss and initiate PrEP.  Insured   [x]   Uninsured  []    Patient Active Problem List   Diagnosis Date Noted   Calculus of gallbladder without cholecystitis without obstruction    History of hepatitis B 07/15/2021   Choledocholithiasis 07/15/2021   Positive colorectal cancer screening using Cologuard test 04/16/2021   Unilateral inguinal hernia without obstruction or gangrene 04/16/2021    Patient's Medications  New Prescriptions   No medications on file  Previous Medications   ALUM & MAG HYDROXIDE-SIMETH (MAALOX/MYLANTA) 200-200-20 MG/5ML SUSPENSION    Take 15 mLs by mouth every 6 (six) hours as needed for indigestion or heartburn.   ASPIRIN EC 81 MG TABLET    Take 1 tablet (81 mg total) by mouth in the morning. Swallow whole.   ASPIRIN-SOD BICARB-CITRIC ACID (ALKA-SELTZER) 325 MG TBEF TABLET    Take 325-650 mg by mouth every 6 (six) hours as needed (indigestion/heartburn.).   CHOLECALCIFEROL (VITAMIN D3) 50 MCG (2000 UT) TABS    Take 2,000 Units by mouth in the morning.   HYDROCHLOROTHIAZIDE (HYDRODIURIL) 25 MG TABLET    Take 25 mg by mouth in the morning.   LOVASTATIN (MEVACOR) 40 MG TABLET    Take 40 mg by mouth every evening.   MELATONIN 10 MG TABS    Take 10 mg by mouth at bedtime.   OMEGA-3 FATTY ACIDS (FISH OIL) 1000 MG CAPS    Take 2,000 mg by mouth at bedtime.   POLYETHYLENE GLYCOL-ELECTROLYTES (NULYTELY) 420 G SOLUTION    As directed   ZOLPIDEM (AMBIEN) 5 MG TABLET    Take 5 mg by mouth at bedtime as needed for sleep.  Modified Medications   No medications on file  Discontinued Medications   No medications on file    Allergies: Allergies  Allergen Reactions   Codeine Other (See Comments)    Swelling of the tongue and can not swallow    Tetracyclines & Related Other (See Comments)    Swelling of the tongue and can not swallow     Past Medical  History: Past Medical History:  Diagnosis Date   Asthma    as a child   Complication of anesthesia    difficulty breathing when waking up   History of hepatitis B    HTN (hypertension)    Hyperlipidemia    Insomnia    PUD (peptic ulcer disease)    Per patient, EGD in the 90s at [facility] with ulcer    Social History: Social History   Socioeconomic History   Marital status: Widowed    Spouse name: Not on file   Number of children: Not on file   Years of education: Not on file   Highest education level: Not on file  Occupational History   Not on file  Tobacco Use   Smoking status: Every Day    Packs/day: 1.00    Years: 60.00    Additional pack years: 0.00    Total pack years: 60.00    Types: Cigarettes   Smokeless tobacco: Never  Vaping Use   Vaping Use: Never used  Substance and Sexual Activity   Alcohol use: Yes    Comment: rarely. maybe at christmas   Drug use: Never   Sexual activity: Yes  Other Topics Concern   Not on file  Social History Narrative   Not on file  Social Determinants of Health   Financial Resource Strain: Not on file  Food Insecurity: Not on file  Transportation Needs: Not on file  Physical Activity: Not on file  Stress: Not on file  Social Connections: Not on file        No data to display          Labs:  SCr: Lab Results  Component Value Date   CREATININE 0.90 09/30/2022   CREATININE 0.78 12/29/2021   CREATININE 0.91 07/15/2021   CREATININE 1.04 05/13/2021   CREATININE 0.74 05/07/2014   HIV No results found for: "HIV" Hepatitis B Lab Results  Component Value Date   HEPBSAB REACTIVE (A) 07/15/2021   HEPBSAG NON-REACTIVE 07/15/2021   Hepatitis C Lab Results  Component Value Date   HEPCAB NON-REACTIVE 07/15/2021   Hepatitis A Lab Results  Component Value Date   HAV REACTIVE (A) 07/15/2021   RPR and STI No results found for: "LABRPR", "RPRTITER"      No data to display          Assessment: 59 yoM  presenting to clinic today to discuss option for HIV PrEP. -Do questionnaire with patient + need for certain baseline STI testing  -Discuss if patient is interested in an injection vs daily tablet  -Once have an idea: can counsel him on specifics on that medication - Cab followup within 1 month, tabs 3 months (find which pharmacy he prefers) -Vaccinations: good on hep A and B (reactive), None on file: PCV 20, Tdap, COVID, mpoxx  Plan: ***

## 2023-04-20 ENCOUNTER — Ambulatory Visit: Payer: Medicare HMO | Admitting: Pharmacist

## 2023-04-20 DIAGNOSIS — Z79899 Other long term (current) drug therapy: Secondary | ICD-10-CM

## 2023-04-20 DIAGNOSIS — Z113 Encounter for screening for infections with a predominantly sexual mode of transmission: Secondary | ICD-10-CM

## 2023-12-01 ENCOUNTER — Other Ambulatory Visit (HOSPITAL_COMMUNITY): Payer: Self-pay | Admitting: Family Medicine

## 2023-12-01 DIAGNOSIS — R062 Wheezing: Secondary | ICD-10-CM

## 2023-12-12 ENCOUNTER — Other Ambulatory Visit (HOSPITAL_COMMUNITY): Payer: Self-pay | Admitting: Family Medicine

## 2023-12-12 DIAGNOSIS — F17200 Nicotine dependence, unspecified, uncomplicated: Secondary | ICD-10-CM

## 2023-12-16 ENCOUNTER — Ambulatory Visit (HOSPITAL_COMMUNITY): Payer: Medicare HMO

## 2023-12-23 ENCOUNTER — Ambulatory Visit (HOSPITAL_COMMUNITY): Payer: 59

## 2023-12-30 ENCOUNTER — Ambulatory Visit (HOSPITAL_COMMUNITY): Payer: 59

## 2024-03-12 ENCOUNTER — Ambulatory Visit (HOSPITAL_COMMUNITY)
Admission: RE | Admit: 2024-03-12 | Discharge: 2024-03-12 | Disposition: A | Discharge status: Home / Self Care | Payer: 59 | Source: Ambulatory Visit | Attending: Family Medicine | Admitting: Family Medicine

## 2024-03-12 DIAGNOSIS — Z122 Encounter for screening for malignant neoplasm of respiratory organs: Secondary | ICD-10-CM | POA: Insufficient documentation

## 2024-03-12 DIAGNOSIS — F17200 Nicotine dependence, unspecified, uncomplicated: Secondary | ICD-10-CM | POA: Insufficient documentation

## 2024-03-12 DIAGNOSIS — I700 Atherosclerosis of aorta: Secondary | ICD-10-CM | POA: Diagnosis not present

## 2024-03-12 DIAGNOSIS — I2510 Atherosclerotic heart disease of native coronary artery without angina pectoris: Secondary | ICD-10-CM | POA: Diagnosis not present

## 2024-03-12 DIAGNOSIS — J439 Emphysema, unspecified: Secondary | ICD-10-CM | POA: Insufficient documentation

## 2024-03-12 DIAGNOSIS — F17210 Nicotine dependence, cigarettes, uncomplicated: Secondary | ICD-10-CM | POA: Insufficient documentation
